# Patient Record
Sex: Female | Born: 1965 | Race: White | Hispanic: No | Marital: Single | Smoking: Never smoker
Health system: Southern US, Community
[De-identification: ages and names within clinical notes are randomized; demographics above are authoritative.]

## PROBLEM LIST (undated history)

## (undated) DIAGNOSIS — F1010 Alcohol abuse, uncomplicated: Secondary | ICD-10-CM

## (undated) DIAGNOSIS — E079 Disorder of thyroid, unspecified: Secondary | ICD-10-CM

## (undated) DIAGNOSIS — D649 Anemia, unspecified: Secondary | ICD-10-CM

## (undated) DIAGNOSIS — K709 Alcoholic liver disease, unspecified: Secondary | ICD-10-CM

## (undated) HISTORY — PX: ANKLE SURGERY: SHX546

## (undated) HISTORY — PX: GASTRIC BYPASS: SHX52

## (undated) HISTORY — PX: CHOLECYSTECTOMY: SHX55

---

## 2015-04-27 ENCOUNTER — Encounter: Payer: Self-pay | Admitting: Emergency Medicine

## 2015-04-27 ENCOUNTER — Emergency Department
Admission: EM | Admit: 2015-04-27 | Discharge: 2015-04-27 | Disposition: A | Discharge status: Home / Self Care | Payer: Self-pay | Attending: Emergency Medicine | Admitting: Emergency Medicine

## 2015-04-27 DIAGNOSIS — T148XXA Other injury of unspecified body region, initial encounter: Secondary | ICD-10-CM

## 2015-04-27 DIAGNOSIS — Z87891 Personal history of nicotine dependence: Secondary | ICD-10-CM | POA: Insufficient documentation

## 2015-04-27 DIAGNOSIS — R05 Cough: Secondary | ICD-10-CM | POA: Insufficient documentation

## 2015-04-27 DIAGNOSIS — L988 Other specified disorders of the skin and subcutaneous tissue: Secondary | ICD-10-CM | POA: Insufficient documentation

## 2015-04-27 HISTORY — DX: Anemia, unspecified: D64.9

## 2015-04-27 HISTORY — DX: Disorder of thyroid, unspecified: E07.9

## 2015-04-27 NOTE — ED Notes (Signed)
Patient reports congestion and cold sx and bottom of nose got irritated during that time.  Now c/o bleeding to irritated area and cannot get it to stop. No active bleeding in triage.  Sent from urgent care bc they think she made need it cauterized.

## 2015-04-27 NOTE — ED Provider Notes (Signed)
[facility] Emergency Department Provider Note ____________________________________________  Time seen: Approximately 1:11 PM  I have reviewed the triage vital signs and the nursing notes.   HISTORY  Chief Complaint   HPI Rhonda Fuentes is a 50 y.o. female who presents today with an irritated bump under the left side of her nose that has been bleeding intermittently for 2 weeks. States she had a cold several weeks ago, and after blowing her nose noticed an area "that cracked open and wouldn't stop bleeding." No previous injuries or skin lesions to nose. Patient clarified that bleeding is from skin under nose and not from inside of nose. After it began bleeding again this morning, she went to urgent care who told her to come to the ED because they thought it may need to be cauterized and didn't have the materials to do this. States it stopped bleeding right before she came to the ED but decided to come anyway since it has been recurring. Positive for rhinorrhea and dry to wet cough. Negative for otalgia, throat pain, SOB, chest pain.  Past Medical History  Diagnosis Date  . Anemia   . Thyroid disease     There are no active problems to display for this patient.   Past Surgical History  Procedure Laterality Date  . Cholecystectomy    . Gastric bypass    . Ankle surgery      No current outpatient prescriptions on file.  Allergies Review of patient's allergies indicates no known allergies.  No family history on file.  Social History Social History  Substance Use Topics  . Smoking status: Former Games developer  . Smokeless tobacco: Not on file  . Alcohol Use: Yes    Review of Systems Constitutional: No fever/chills Eyes: No visual changes. ENT: No otalgia or sore throat. Cardiovascular: Denies chest pain. Respiratory: Mild cough. Denies shortness of breath. Gastrointestinal: No abdominal pain.  No nausea, no vomiting.  No diarrhea.  No  constipation. Genitourinary: Negative for dysuria. Musculoskeletal: Negative for back pain. Skin: Positive for irritated area under nose. Negative for active bleeding. See HPI Neurological: Negative for headaches, focal weakness or numbness.  10-point ROS otherwise negative.  ____________________________________________   PHYSICAL EXAM:  VITAL SIGNS: ED Triage Vitals  Enc Vitals Group     BP 04/27/15 1246 124/60 mmHg     Pulse Rate 04/27/15 1246 108     Resp 04/27/15 1246 18     Temp 04/27/15 1246 97.8 F (36.6 C)     Temp Source 04/27/15 1246 Oral     SpO2 04/27/15 1246 98 %     Weight 04/27/15 1246 174 lb (78.926 kg)     Height 04/27/15 1246  (1.727 m)     Head Cir --      Peak Flow --      Pain Score 04/27/15 1246 2     Pain Loc --      Pain Edu? --      Excl. in GC? --     Constitutional: Alert and oriented. Well appearing and in no acute distress. Eyes: Conjunctivae are normal. PERRL. EOMI. Head: Atraumatic. Nose: No epistaxis or rhinorrhea.  Mouth/Throat: Mucous membranes are moist.  Oropharynx non-erythematous. Neck: No stridor.   Cardiovascular: Normal rate, regular rhythm. Grossly normal heart sounds.  Good peripheral circulation. Respiratory: Normal respiratory effort.  No retractions. Lungs CTAB. Gastrointestinal: Soft and nontender. No distention. No abdominal bruits. No CVA tenderness. Musculoskeletal: No lower extremity tenderness nor edema.  No  joint effusions. Neurologic:  Normal speech and language. No gross focal neurologic deficits are appreciated. No gait instability. Skin:  Small, 2 mm vesicle under left nostril with erythema and dried blood. No active bleeding. Skin is warm and dry. No rash noted.  Psychiatric: Mood and affect are normal. Speech and behavior are normal.  ____________________________________________   LABS (all labs ordered are listed, but only abnormal results are displayed)  Labs Reviewed - No data to  display ____________________________________________  EKG   ____________________________________________  RADIOLOGY   ____________________________________________   PROCEDURES  Procedure(s) performed: None  Critical Care performed: No  ____________________________________________   INITIAL IMPRESSION / ASSESSMENT AND PLAN / ED COURSE  Pertinent labs & imaging results that were available during my care of the patient were reviewed by me and considered in my medical decision making (see chart for details).  Hemorrhagic cutaneous lesion ____________________________________________   FINAL CLINICAL IMPRESSION(S) / ED DIAGNOSES  Final diagnoses:  Blood blister      Joni Reining, PA-C 04/27/15 1336  Rockne Menghini, MD 04/27/15 [phone]

## 2015-04-27 NOTE — Discharge Instructions (Signed)
Advised trial of OTC preparation to freezed the lesion. If not resolved follow up with Dermatologist.

## 2015-04-27 NOTE — ED Notes (Signed)
States congested  And nose became irritated

## 2015-07-15 ENCOUNTER — Inpatient Hospital Stay
Admission: EM | Admit: 2015-07-15 | Discharge: 2015-07-19 | DRG: 433 | Disposition: A | Discharge status: Home / Self Care | Payer: BLUE CROSS/BLUE SHIELD | Attending: Internal Medicine | Admitting: Internal Medicine

## 2015-07-15 ENCOUNTER — Inpatient Hospital Stay: Payer: BLUE CROSS/BLUE SHIELD

## 2015-07-15 DIAGNOSIS — K7040 Alcoholic hepatic failure without coma: Secondary | ICD-10-CM | POA: Diagnosis present

## 2015-07-15 DIAGNOSIS — I8510 Secondary esophageal varices without bleeding: Secondary | ICD-10-CM | POA: Diagnosis present

## 2015-07-15 DIAGNOSIS — K7031 Alcoholic cirrhosis of liver with ascites: Secondary | ICD-10-CM | POA: Diagnosis present

## 2015-07-15 DIAGNOSIS — Z79899 Other long term (current) drug therapy: Secondary | ICD-10-CM | POA: Diagnosis not present

## 2015-07-15 DIAGNOSIS — R600 Localized edema: Secondary | ICD-10-CM | POA: Diagnosis present

## 2015-07-15 DIAGNOSIS — E639 Nutritional deficiency, unspecified: Secondary | ICD-10-CM

## 2015-07-15 DIAGNOSIS — F5104 Psychophysiologic insomnia: Secondary | ICD-10-CM | POA: Diagnosis present

## 2015-07-15 DIAGNOSIS — K7011 Alcoholic hepatitis with ascites: Secondary | ICD-10-CM | POA: Diagnosis present

## 2015-07-15 DIAGNOSIS — E039 Hypothyroidism, unspecified: Secondary | ICD-10-CM | POA: Diagnosis present

## 2015-07-15 DIAGNOSIS — Z9884 Bariatric surgery status: Secondary | ICD-10-CM

## 2015-07-15 DIAGNOSIS — F1021 Alcohol dependence, in remission: Secondary | ICD-10-CM | POA: Diagnosis present

## 2015-07-15 DIAGNOSIS — I864 Gastric varices: Secondary | ICD-10-CM | POA: Diagnosis present

## 2015-07-15 DIAGNOSIS — E871 Hypo-osmolality and hyponatremia: Secondary | ICD-10-CM | POA: Diagnosis present

## 2015-07-15 DIAGNOSIS — E860 Dehydration: Secondary | ICD-10-CM | POA: Diagnosis present

## 2015-07-15 DIAGNOSIS — K766 Portal hypertension: Secondary | ICD-10-CM | POA: Diagnosis present

## 2015-07-15 DIAGNOSIS — K7200 Acute and subacute hepatic failure without coma: Secondary | ICD-10-CM

## 2015-07-15 HISTORY — DX: Alcohol abuse, uncomplicated: F10.10

## 2015-07-15 LAB — PROTIME-INR
INR: 1.71
Prothrombin Time: 20.1 seconds — ABNORMAL HIGH (ref 11.4–15.0)

## 2015-07-15 LAB — CBC WITH DIFFERENTIAL/PLATELET
BASOS ABS: 0 10*3/uL (ref 0–0.1)
BASOS PCT: 0 %
Eosinophils Absolute: 0 10*3/uL (ref 0–0.7)
Eosinophils Relative: 0 %
HEMATOCRIT: 41.5 % (ref 35.0–47.0)
HEMOGLOBIN: 14.2 g/dL (ref 12.0–16.0)
LYMPHS PCT: 8 %
Lymphs Abs: 0.9 10*3/uL — ABNORMAL LOW (ref 1.0–3.6)
MCH: 41.2 pg — ABNORMAL HIGH (ref 26.0–34.0)
MCHC: 34.1 g/dL (ref 32.0–36.0)
MCV: 120.6 fL — AB (ref 80.0–100.0)
MONO ABS: 1 10*3/uL — AB (ref 0.2–0.9)
Monocytes Relative: 9 %
NEUTROS ABS: 9.4 10*3/uL — AB (ref 1.4–6.5)
NEUTROS PCT: 83 %
Platelets: 203 10*3/uL (ref 150–440)
RBC: 3.45 MIL/uL — AB (ref 3.80–5.20)
RDW: 16.6 % — AB (ref 11.5–14.5)
WBC: 11.3 10*3/uL — AB (ref 3.6–11.0)

## 2015-07-15 LAB — COMPREHENSIVE METABOLIC PANEL
ALBUMIN: 2.8 g/dL — AB (ref 3.5–5.0)
ALT: 72 U/L — ABNORMAL HIGH (ref 14–54)
ANION GAP: 11 (ref 5–15)
AST: 220 U/L — AB (ref 15–41)
Alkaline Phosphatase: 270 U/L — ABNORMAL HIGH (ref 38–126)
BILIRUBIN TOTAL: 18.4 mg/dL — AB (ref 0.3–1.2)
BUN: 6 mg/dL (ref 6–20)
CALCIUM: 8.2 mg/dL — AB (ref 8.9–10.3)
CO2: 25 mmol/L (ref 22–32)
Chloride: 91 mmol/L — ABNORMAL LOW (ref 101–111)
Creatinine, Ser: 0.36 mg/dL — ABNORMAL LOW (ref 0.44–1.00)
GLUCOSE: 103 mg/dL — AB (ref 65–99)
POTASSIUM: 4 mmol/L (ref 3.5–5.1)
SODIUM: 127 mmol/L — AB (ref 135–145)
TOTAL PROTEIN: 7 g/dL (ref 6.5–8.1)

## 2015-07-15 LAB — VITAMIN B12: VITAMIN B 12: 936 pg/mL — AB (ref 180–914)

## 2015-07-15 LAB — TSH: TSH: 2.968 u[IU]/mL (ref 0.350–4.500)

## 2015-07-15 LAB — TROPONIN I: Troponin I: 0.03 ng/mL (ref <0.031)

## 2015-07-15 LAB — ACETAMINOPHEN LEVEL: Acetaminophen (Tylenol), Serum: <10 ug/mL — ABNORMAL LOW (ref 10–30)

## 2015-07-15 LAB — LIPASE, BLOOD: LIPASE: 16 U/L (ref 11–51)

## 2015-07-15 LAB — BILIRUBIN, DIRECT: BILIRUBIN DIRECT: 7.7 mg/dL — AB (ref 0.1–0.5)

## 2015-07-15 LAB — FOLATE: FOLATE: 10 ng/mL (ref >5.9)

## 2015-07-15 LAB — AMMONIA: AMMONIA: 50 umol/L — AB (ref 9–35)

## 2015-07-15 LAB — T4, FREE: FREE T4: 1.46 ng/dL — AB (ref 0.61–1.12)

## 2015-07-15 LAB — OSMOLALITY: Osmolality: 278 mOsm/kg (ref 275–295)

## 2015-07-15 LAB — LACTATE DEHYDROGENASE: LDH: 569 U/L — ABNORMAL HIGH (ref 98–192)

## 2015-07-15 LAB — ETHANOL

## 2015-07-15 MED ORDER — ONDANSETRON HCL 4 MG PO TABS: 4.0000 mg | ORAL_TABLET | Freq: Four times a day (QID) | ORAL | Status: DC | PRN

## 2015-07-15 MED ORDER — SODIUM CHLORIDE 0.9% FLUSH: 3.0000 mL | INTRAVENOUS | Status: DC | PRN

## 2015-07-15 MED ORDER — OXYCODONE HCL 5 MG PO TABS
5.0000 mg | ORAL_TABLET | ORAL | Status: DC | PRN
  Administered 2015-07-15 – 2015-07-18 (×11): 5 mg via ORAL
  Filled 2015-07-15 (×12): qty 1

## 2015-07-15 MED ORDER — FOLIC ACID 1 MG PO TABS
1.0000 mg | ORAL_TABLET | Freq: Every day | ORAL | Status: DC
  Administered 2015-07-16 – 2015-07-19 (×4): 1 mg via ORAL
  Filled 2015-07-15 (×4): qty 1

## 2015-07-15 MED ORDER — VITAMIN B-1 100 MG PO TABS
100.0000 mg | ORAL_TABLET | Freq: Every day | ORAL | Status: DC
  Administered 2015-07-16 – 2015-07-19 (×4): 100 mg via ORAL
  Filled 2015-07-15 (×4): qty 1

## 2015-07-15 MED ORDER — SODIUM CHLORIDE 0.9 % IV BOLUS (SEPSIS)
1000.0000 mL | Freq: Once | INTRAVENOUS | Status: AC
  Administered 2015-07-15: 1000 mL via INTRAVENOUS

## 2015-07-15 MED ORDER — SPIRONOLACTONE 25 MG PO TABS: 50.0000 mg | ORAL_TABLET | Freq: Every day | ORAL | Status: DC

## 2015-07-15 MED ORDER — FOLIC ACID 1 MG PO TABS: 1.0000 mg | ORAL_TABLET | Freq: Every day | ORAL | Status: DC

## 2015-07-15 MED ORDER — FOLIC ACID 1 MG PO TABS
1.0000 mg | ORAL_TABLET | Freq: Once | ORAL | Status: AC
  Administered 2015-07-15: 1 mg via ORAL
  Filled 2015-07-15: qty 1

## 2015-07-15 MED ORDER — FUROSEMIDE 40 MG PO TABS: 20.0000 mg | ORAL_TABLET | Freq: Every day | ORAL | Status: DC

## 2015-07-15 MED ORDER — THIAMINE HCL 100 MG/ML IJ SOLN
100.0000 mg | Freq: Once | INTRAMUSCULAR | Status: AC
  Administered 2015-07-15: 100 mg via INTRAVENOUS
  Filled 2015-07-15: qty 2

## 2015-07-15 MED ORDER — SODIUM CHLORIDE 0.9% FLUSH: 3.0000 mL | Freq: Two times a day (BID) | INTRAVENOUS | Status: DC

## 2015-07-15 MED ORDER — SODIUM CHLORIDE 0.9 % IV SOLN: 250.0000 mL | INTRAVENOUS | Status: DC | PRN

## 2015-07-15 MED ORDER — SODIUM CHLORIDE 0.9 % IV BOLUS (SEPSIS)
500.0000 mL | Freq: Once | INTRAVENOUS | Status: AC
  Administered 2015-07-15: 500 mL via INTRAVENOUS

## 2015-07-15 MED ORDER — ONDANSETRON HCL 4 MG/2ML IJ SOLN
4.0000 mg | Freq: Four times a day (QID) | INTRAMUSCULAR | Status: DC | PRN
  Administered 2015-07-17: 4 mg via INTRAVENOUS
  Filled 2015-07-15: qty 2

## 2015-07-15 MED ORDER — SODIUM CHLORIDE 0.9% FLUSH
3.0000 mL | Freq: Two times a day (BID) | INTRAVENOUS | Status: DC
  Administered 2015-07-15 – 2015-07-19 (×9): 3 mL via INTRAVENOUS

## 2015-07-15 MED ORDER — LEVOTHYROXINE SODIUM 50 MCG PO TABS
50.0000 ug | ORAL_TABLET | Freq: Every day | ORAL | Status: DC
  Administered 2015-07-16 – 2015-07-18 (×3): 50 ug via ORAL
  Filled 2015-07-15 (×4): qty 1

## 2015-07-15 MED ORDER — PANTOPRAZOLE SODIUM 40 MG PO TBEC
40.0000 mg | DELAYED_RELEASE_TABLET | Freq: Every day | ORAL | Status: DC
  Administered 2015-07-15 – 2015-07-19 (×5): 40 mg via ORAL
  Filled 2015-07-15 (×5): qty 1

## 2015-07-15 MED ORDER — LACTULOSE 10 GM/15ML PO SOLN
20.0000 g | Freq: Two times a day (BID) | ORAL | Status: DC
  Administered 2015-07-15 (×2): 20 g via ORAL
  Filled 2015-07-15 (×2): qty 30

## 2015-07-15 NOTE — Progress Notes (Signed)
Pt. C/o back pain and requests heating pad. Dr. Toney RakesSuidini notified and ordered k-pad.

## 2015-07-15 NOTE — ED Notes (Signed)
Pt returned from US at this time.

## 2015-07-15 NOTE — H&P (Signed)
[facility] - [place] [facility]   PATIENT NAME: Rhonda Fuentes    MR#:  [id number]  DATE OF BIRTH:  05-22-1965  DATE OF ADMISSION:  07/15/2015  PRIMARY CARE PHYSICIAN: No PCP Per Patient   REQUESTING/REFERRING PHYSICIAN: Dr. Scotty Court  CHIEF COMPLAINT:   Chief Complaint  Patient presents with  . Cough  . Leg Swelling  . Weakness    HISTORY OF PRESENT ILLNESS:  Rhonda Fuentes  is a 50 y.o. female with a known history of anemia, hypothyroidism, alcohol abuse presents to the emergency room complaining of 10 days of yellowing of the eyes and skin and loss of balance. Patient has been feeling weak since January when she had a chest cold and never recovered from the weakness. Had a mechanical fall. Complains of some dizziness. Has noticed some dark stools but stool for Hemoccult in the emergency room was negative. No melena or bleeding. Does notice easy bruising. She drinks 2-3 large drinks of vodka every day. She quit 10 days back and went through withdrawals along with hallucinations at home. Presently feels well. Here patient has been found to have elevated bilirubin of 18 along with elevated liver function tests. She has also been using intermittent Tylenol. She has also noticed lower extremity edema and abdominal distention for the past 10 days.  PAST MEDICAL HISTORY:   Past Medical History  Diagnosis Date  . Anemia   . Thyroid disease   . Alcohol abuse     PAST SURGICAL HISTORY:   Past Surgical History  Procedure Laterality Date  . Cholecystectomy    . Gastric bypass    . Ankle surgery      SOCIAL HISTORY:   Social History  Substance Use Topics  . Smoking status: Never Smoker   . Smokeless tobacco: Not on file  . Alcohol Use: 12.0 oz/week    20 Standard drinks or equivalent per week    FAMILY HISTORY:   Family History  Problem Relation Age of Onset  . Breast cancer Sister     DRUG ALLERGIES:  No Known Allergies  REVIEW OF  SYSTEMS:   Review of Systems  Constitutional: Positive for malaise/fatigue. Negative for fever and chills.  HENT: Negative for sore throat.   Eyes: Negative for blurred vision, double vision and pain.  Respiratory: Negative for cough, hemoptysis, shortness of breath and wheezing.   Cardiovascular: Negative for chest pain, palpitations, orthopnea and leg swelling.  Gastrointestinal: Negative for heartburn, nausea, vomiting, abdominal pain, diarrhea and constipation.  Genitourinary: Negative for dysuria and hematuria.  Musculoskeletal: Positive for myalgias. Negative for back pain and joint pain.  Skin: Negative for rash.  Neurological: Positive for dizziness and weakness. Negative for sensory change, speech change, focal weakness and headaches.  Endo/Heme/Allergies: Bruises/bleeds easily.  Psychiatric/Behavioral: Positive for hallucinations. Negative for depression. The patient is not nervous/anxious.     MEDICATIONS AT HOME:   Prior to Admission medications   Medication Sig Start Date End Date Taking? Authorizing Provider  levothyroxine (SYNTHROID, LEVOTHROID) 50 MCG tablet Take 1 tablet by mouth daily. 05/26/15  Yes Historical Provider, MD  traZODone (DESYREL) 50 MG tablet Take 1 tablet by mouth at bedtime. 05/26/15  Yes Historical Provider, MD     VITAL SIGNS:  Blood pressure 106/81, pulse 106, temperature 97.9 F (36.6 C), temperature source Oral, resp. rate 30, height  (1.727 m), weight 91.627 kg (202 lb), last menstrual period 10/21/2014, SpO2 98 %.  PHYSICAL EXAMINATION:  Physical Exam  GENERAL:  50 y.o.-year-old patient lying in the bed with no acute distress.  EYES: Pupils equal, round, reactive to light and accommodation.  Extraocular muscles intact. Scleral icterus HEENT: Head atraumatic, normocephalic. Oropharynx and nasopharynx clear. No oropharyngeal erythema, moist oral mucosa  NECK:  Supple, no jugular venous distention. No thyroid enlargement, no tenderness.   LUNGS: Normal breath sounds bilaterally, no wheezing, rales, rhonchi. No use of accessory muscles of respiration.  CARDIOVASCULAR: S1, S2 normal. No murmurs, rubs, or gallops.  ABDOMEN: Soft, nontender, nondistended. Bowel sounds present. No organomegaly or mass.  EXTREMITIES: No cyanosis, or clubbing. + 2 pedal & radial pulses b/l.  2+ lower extremity edema. NEUROLOGIC: Cranial nerves II through XII are intact. No focal Motor or sensory deficits appreciated b/l PSYCHIATRIC: The patient is alert and oriented x 3. Good affect.  SKIN: No obvious rash, lesion, or ulcer. Icterus  LABORATORY PANEL:   CBC  Recent Labs Lab 07/15/15 1050  WBC 11.3*  HGB 14.2  HCT 41.5  PLT 203   ------------------------------------------------------------------------------------------------------------------  Chemistries   Recent Labs Lab 07/15/15 1050  NA 127*  K 4.0  CL 91*  CO2 25  GLUCOSE 103*  BUN 6  CREATININE 0.36*  CALCIUM 8.2*  AST 220*  ALT 72*  ALKPHOS 270*  BILITOT 18.4*   ------------------------------------------------------------------------------------------------------------------  Cardiac Enzymes  Recent Labs Lab 07/15/15 1050  TROPONINI 0.03   ------------------------------------------------------------------------------------------------------------------  RADIOLOGY:  No results found.   IMPRESSION AND PLAN:   * Acute alcoholic hepatitis Check direct bilirubin. INR. Acute hepatitis panel. Will need to start steroids if patient meets criteria once INR level is available. Consult GI. Check abdominal ultrasound Protonix for GI prophylaxis  * Hypovolemic hyponatremia Patient seems dehydrated although she has some lower extremity edema.  Bolus 500 ML normal saline.  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 Waiting on INR.  All the records are reviewed  and case discussed with ED provider. Management plans discussed with the patient, family and they are in agreement.  CODE STATUS: FULL CODE  TOTAL TIME TAKING CARE OF THIS PATIENT: 45 minutes.   Milagros LollSudini, Miklo Aken R M.D on 07/15/2015 at 1:06 PM  Between 7am to 6pm - Pager - [phone]  After 6pm go to [email or web address] - password [email or web address] [facility]  [facility]  Office  [phone]  CC: Primary care physician; No PCP Per Patient  Note: This dictation was prepared with Dragon dictation along with smaller phrase technology. Any transcriptional errors that result from this process are unintentional.

## 2015-07-15 NOTE — ED Notes (Signed)
Pt c/o cough, congestion since Jan.. States progressed with Bl LE swelling with dizziness for the past 3 weeks, then noticed skin color yellow a few days ago..Marland Kitchen

## 2015-07-15 NOTE — ED Provider Notes (Signed)
[facility] Emergency Department Provider Note  ____________________________________________  Time seen: 12:00 PM  I have reviewed the triage vital signs and the nursing notes.   HISTORY  Chief Complaint Cough; Leg Swelling; and Weakness    HPI Rhonda Fuentes is a 50 y.o. female who complains of yellow eyes and skin worsening for the past 2 weeks as well as bilateral lower extremity swelling and dizziness worsening over the last 3 weeks as well.She's been very unsteady on her feet and feels like her balance is diminished.  Denies any pruritus, no chest pain abdominal pain nausea vomiting diarrhea or shortness of breath. She does feel like her stools have been blacker recently. No gross bleeding. She is a chronic daily drinker. She reports 1-3 drinks daily, however 10 days ago because of these issues, she abruptly stop drinking entirely. 7 days ago she became shaky and then had some visual hallucinations where she saw mice running around on the staircase and going through holes in the wall. Her husband reports that there were no mice and no holes in the wall. She also had been chronically using intermittent doses of Tylenol which she also stopped about 10 days ago.  Denies falls or head trauma   Past Medical History  Diagnosis Date  . Anemia   . Thyroid disease   Hypothyroidism   There are no active problems to display for this patient.    Past Surgical History  Procedure Laterality Date  . Cholecystectomy    . Gastric bypass    . Ankle surgery       Current Outpatient Rx  Name  Route  Sig  Dispense  Refill  . levothyroxine (SYNTHROID, LEVOTHROID) 50 MCG tablet   Oral   Take 1 tablet by mouth daily.      2   . traZODone (DESYREL) 50 MG tablet   Oral   Take 1 tablet by mouth at bedtime.      2      Allergies Review of patient's allergies indicates no known allergies.   No family history on file.  Social History Social History   Substance Use Topics  . Smoking status: Never Smoker   . Smokeless tobacco: None  . Alcohol Use: Yes  No history of drug use other than edible cannabis  Review of Systems  Constitutional:   No fever or chills. No weight changes Eyes:   No vision changes. Yelling of the eyes ENT:   No sore throat. No rhinorrhea. Cardiovascular:   No chest pain. Respiratory:   No dyspnea or cough. Gastrointestinal:   Negative for abdominal pain, vomiting and diarrhea.  No BRBPR or melena. Genitourinary:   Negative for dysuria or difficulty urinating. Musculoskeletal:   Negative for focal pain or swelling Skin:   Diffuse swelling of the abdomen and lower extremities. Yellowing of the skin Neurological:   Negative for headaches, focal weakness or numbness. Positive dizziness and unsteadiness with balance and walking  10-point ROS otherwise negative.  ____________________________________________   PHYSICAL EXAM:  VITAL SIGNS: ED Triage Vitals  Enc Vitals Group     BP 07/15/15 1036 97/57 mmHg     Pulse Rate 07/15/15 1036 108     Resp 07/15/15 1036 18     Temp 07/15/15 1036 97.9 F (36.6 C)     Temp Source 07/15/15 1036 Oral     SpO2 07/15/15 1036 97 %     Weight 07/15/15 1036 202 lb (91.627 kg)     Height 07/15/15  1036  (1.727 m)     Head Cir --      Peak Flow --      Pain Score 07/15/15 1045 2     Pain Loc --      Pain Edu? --      Excl. in GC? --     Vital signs reviewed, nursing assessments reviewed.   Constitutional:   Alert and oriented. Well appearing and in no distress. Eyes:   Bright scleral icterus. No conjunctival pallor. PERRL. EOMI ENT   Head:   Normocephalic and atraumatic.   Nose:   No congestion/rhinnorhea. No septal hematoma   Mouth/Throat:   Dry mucous membranes, no pharyngeal erythema. No peritonsillar mass.    Neck:   No stridor. No SubQ emphysema. No meningismus. Hematological/Lymphatic/Immunilogical:   No cervical  lymphadenopathy. Cardiovascular:   Tachycardia heart rate 110. Symmetric bilateral radial and DP pulses.  No murmurs.  Respiratory:   Normal respiratory effort without tachypnea nor retractions. Breath sounds are clear and equal bilaterally. No wheezes/rales/rhonchi. Gastrointestinal:   Soft and nontender. Mildly distended, likely ascites. There is no CVA tenderness.  No rebound, rigidity, or guarding. Rectal exam performed with nurse Megan, brown stool, Hemoccult negative, QC controls okay Genitourinary:   deferred Musculoskeletal:   Nontender with normal range of motion in all extremities. No joint effusions.  No lower extremity tenderness.  Trace pitting edema bilateral lower extremities. Neurologic:   Normal speech and language.  CN 2-10 normal. Motor grossly intact. No gross focal neurologic deficits are appreciated.  Skin:    Skin is warm, dry and intact. No rash noted.  No petechiae, purpura, or bullae. Psychiatric:   Mood and affect are normal. ____________________________________________    LABS (pertinent positives/negatives) (all labs ordered are listed, but only abnormal results are displayed) Labs Reviewed  ACETAMINOPHEN LEVEL - Abnormal; Notable for the following:    Acetaminophen (Tylenol), Serum <10 (*)    All other components within normal limits  COMPREHENSIVE METABOLIC PANEL - Abnormal; Notable for the following:    Sodium 127 (*)    Chloride 91 (*)    Glucose, Bld 103 (*)    Creatinine, Ser 0.36 (*)    Calcium 8.2 (*)    Albumin 2.8 (*)    AST 220 (*)    ALT 72 (*)    Alkaline Phosphatase 270 (*)    Total Bilirubin 18.4 (*)    All other components within normal limits  CBC WITH DIFFERENTIAL/PLATELET - Abnormal; Notable for the following:    WBC 11.3 (*)    RBC 3.45 (*)    MCV 120.6 (*)    MCH 41.2 (*)    RDW 16.6 (*)    Neutro Abs 9.4 (*)    Lymphs Abs 0.9 (*)    Monocytes Absolute 1.0 (*)    All other components within normal limits  AMMONIA -  Abnormal; Notable for the following:    Ammonia 50 (*)    All other components within normal limits  TROPONIN I  ETHANOL  LIPASE, BLOOD  URINALYSIS COMPLETEWITH MICROSCOPIC ([facility] ONLY)  TSH  T4, FREE   ____________________________________________   EKG    ____________________________________________    RADIOLOGY    ____________________________________________   PROCEDURES   ____________________________________________   INITIAL IMPRESSION / ASSESSMENT AND PLAN / ED COURSE  Pertinent labs & imaging results that were available during my care of the patient were reviewed by me and considered in my medical decision making (see chart for details).  Patient presents with icterus and jaundice and by report a history of alcohol dependence that has recently completed spontaneous detox at home without medications. At present she does not appear to be in withdrawal and a low suspicion for DTs or autonomic instability at present time. She does however have acute liver failure on labs with hyponatremia and hyperbilirubinemia and elevations of her LFTs. Tylenol level is negative currently. Blood counts are acceptable although her MCV is markedly elevated. Her neurologic symptoms may be due to the hypernatremia or more likely due to severe nutritional depletion. We'll give thiamine and folate in the emergency department, IV fluids for dehydration, plan to admit for further evaluation.     ____________________________________________   FINAL CLINICAL IMPRESSION(S) / ED DIAGNOSES  Final diagnoses:  Acute liver failure  Alcoholic hepatitis with ascites  Nutritional deficiency  Hyponatremia    Sharman Cheek, MD 07/15/15 1225

## 2015-07-15 NOTE — ED Notes (Signed)
Report given to Matthew, RN 

## 2015-07-16 ENCOUNTER — Encounter: Payer: Self-pay | Admitting: Radiology

## 2015-07-16 ENCOUNTER — Inpatient Hospital Stay: Payer: BLUE CROSS/BLUE SHIELD

## 2015-07-16 LAB — URINALYSIS COMPLETE WITH MICROSCOPIC (ARMC ONLY)
Bacteria, UA: NONE SEEN
GLUCOSE, UA: NEGATIVE mg/dL
HGB URINE DIPSTICK: NEGATIVE
Ketones, ur: NEGATIVE mg/dL
LEUKOCYTES UA: NEGATIVE
NITRITE: NEGATIVE
Protein, ur: NEGATIVE mg/dL
SPECIFIC GRAVITY, URINE: 1.034 — AB (ref 1.005–1.030)
pH: 6 (ref 5.0–8.0)

## 2015-07-16 LAB — COMPREHENSIVE METABOLIC PANEL
ALT: 52 U/L (ref 14–54)
AST: 160 U/L — ABNORMAL HIGH (ref 15–41)
Albumin: 2 g/dL — ABNORMAL LOW (ref 3.5–5.0)
Alkaline Phosphatase: 161 U/L — ABNORMAL HIGH (ref 38–126)
Anion gap: 8 (ref 5–15)
BUN: 5 mg/dL — ABNORMAL LOW (ref 6–20)
CHLORIDE: 98 mmol/L — AB (ref 101–111)
CO2: 23 mmol/L (ref 22–32)
CREATININE: 0.39 mg/dL — AB (ref 0.44–1.00)
Calcium: 7.4 mg/dL — ABNORMAL LOW (ref 8.9–10.3)
Glucose, Bld: 124 mg/dL — ABNORMAL HIGH (ref 65–99)
Potassium: 3.2 mmol/L — ABNORMAL LOW (ref 3.5–5.1)
Sodium: 129 mmol/L — ABNORMAL LOW (ref 135–145)
Total Bilirubin: 13.6 mg/dL — ABNORMAL HIGH (ref 0.3–1.2)
Total Protein: 5.3 g/dL — ABNORMAL LOW (ref 6.5–8.1)

## 2015-07-16 LAB — CBC
HCT: 34.6 % — ABNORMAL LOW (ref 35.0–47.0)
Hemoglobin: 11.9 g/dL — ABNORMAL LOW (ref 12.0–16.0)
MCH: 43 pg — ABNORMAL HIGH (ref 26.0–34.0)
MCHC: 34.5 g/dL (ref 32.0–36.0)
MCV: 124.4 fL — ABNORMAL HIGH (ref 80.0–100.0)
PLATELETS: 133 10*3/uL — AB (ref 150–440)
RBC: 2.78 MIL/uL — AB (ref 3.80–5.20)
RDW: 15.8 % — ABNORMAL HIGH (ref 11.5–14.5)
WBC: 7.9 10*3/uL (ref 3.6–11.0)

## 2015-07-16 LAB — HEPATITIS PANEL, ACUTE
HCV Ab: <0.1 s/co ratio (ref 0.0–0.9)
Hep A IgM: NEGATIVE
Hep B C IgM: NEGATIVE
Hepatitis B Surface Ag: NEGATIVE

## 2015-07-16 LAB — PROTIME-INR
INR: 2.23
PROTHROMBIN TIME: 24.5 s — AB (ref 11.4–15.0)

## 2015-07-16 LAB — OSMOLALITY, URINE: OSMOLALITY UR: 200 mosm/kg — AB (ref 300–900)

## 2015-07-16 LAB — SODIUM, URINE, RANDOM

## 2015-07-16 LAB — CREATININE, URINE, RANDOM: Creatinine, Urine: 32 mg/dL

## 2015-07-16 LAB — AMMONIA: AMMONIA: 70 umol/L — AB (ref 9–35)

## 2015-07-16 MED ORDER — LACTULOSE 10 GM/15ML PO SOLN
30.0000 g | Freq: Two times a day (BID) | ORAL | Status: DC
Start: 2015-07-16 — End: 2015-07-19
  Administered 2015-07-16 – 2015-07-19 (×7): 30 g via ORAL
  Filled 2015-07-16 (×7): qty 60

## 2015-07-16 MED ORDER — METHYLPREDNISOLONE SODIUM SUCC 125 MG IJ SOLR
125.0000 mg | Freq: Every day | INTRAMUSCULAR | Status: DC
  Administered 2015-07-16 – 2015-07-19 (×4): 125 mg via INTRAVENOUS
  Filled 2015-07-16 (×4): qty 2

## 2015-07-16 MED ORDER — IOPAMIDOL (ISOVUE-300) INJECTION 61%
100.0000 mL | Freq: Once | INTRAVENOUS | Status: AC | PRN
  Administered 2015-07-16: 100 mL via INTRAVENOUS

## 2015-07-16 NOTE — Progress Notes (Signed)
[facility]   PATIENT NAME: Rhonda Fuentes    MR#:  [id number]  DATE OF BIRTH:  1965-05-04  SUBJECTIVE:  CHIEF COMPLAINT:   Chief Complaint  Patient presents with  . Cough  . Leg Swelling  . Weakness      Have jaundice, was a chronic alcoholic.    Some abdominal distention, but not much pain.  REVIEW OF SYSTEMS:  CONSTITUTIONAL: No fever, positive for fatigue or weakness.  EYES: No blurred or double vision.  EARS, NOSE, AND THROAT: No tinnitus or ear pain.  RESPIRATORY: No cough, shortness of breath, wheezing or hemoptysis.  CARDIOVASCULAR: No chest pain, orthopnea, edema.  GASTROINTESTINAL: No nausea, vomiting, diarrhea or abdominal pain.  GENITOURINARY: No dysuria, hematuria.  ENDOCRINE: No polyuria, nocturia,  HEMATOLOGY: No anemia, easy bruising or bleeding SKIN: No rash or lesion.yellowish colour. MUSCULOSKELETAL: No joint pain or arthritis.   NEUROLOGIC: No tingling, numbness, weakness.  PSYCHIATRY: No anxiety or depression.   ROS  DRUG ALLERGIES:  No Known Allergies  VITALS:  Blood pressure 102/64, pulse 103, temperature 98.3 F (36.8 C), temperature source Oral, resp. rate 16, height 5\' 8"  (1.727 m), weight 93.804 kg (206 lb 12.8 oz), last menstrual period 10/21/2014, SpO2 95 %.  PHYSICAL EXAMINATION:  GENERAL:  50 y.o.-year-old patient lying in the bed with no acute distress.  EYES: Pupils equal, round, reactive to light and accommodation. No scleral icterus. Extraocular muscles intact. Sclera yellow. HEENT: Head atraumatic, normocephalic. Oropharynx and nasopharynx clear.  NECK:  Supple, no jugular venous distention. No thyroid enlargement, no tenderness.  LUNGS: Normal breath sounds bilaterally, no wheezing, rales,rhonchi or crepitation. No use of accessory muscles of respiration.  CARDIOVASCULAR: S1, S2 normal. No murmurs, rubs, or gallops.  ABDOMEN: Soft, nontender, mild distended. Bowel sounds present. No  organomegaly or mass.  EXTREMITIES: No pedal edema, cyanosis, or clubbing.  NEUROLOGIC: Cranial nerves II through XII are intact. Muscle strength 5/5 in all extremities. Sensation intact. Gait not checked. No tremors. PSYCHIATRIC: The patient is alert and oriented x 3.  SKIN: No obvious rash, lesion, or ulcer. Yellowish skin.  Physical Exam LABORATORY PANEL:   CBC  Recent Labs Lab 07/16/15 0425  WBC 7.9  HGB 11.9*  HCT 34.6*  PLT 133*   ------------------------------------------------------------------------------------------------------------------  Chemistries   Recent Labs Lab 07/16/15 0425  NA 129*  K 3.2*  CL 98*  CO2 23  GLUCOSE 124*  BUN 5*  CREATININE 0.39*  CALCIUM 7.4*  AST 160*  ALT 52  ALKPHOS 161*  BILITOT 13.6*   ------------------------------------------------------------------------------------------------------------------  Cardiac Enzymes  Recent Labs Lab 07/15/15 1050  TROPONINI 0.03   ------------------------------------------------------------------------------------------------------------------  RADIOLOGY:  [place] Abdomen Limited Ruq  07/15/2015  CLINICAL DATA:  Alcoholic hepatitis with ascites. EXAM: US ABDOMEN LIMITED - RIGHT UPPER QUADRANT COMPARISON:  None. FINDINGS: Gallbladder: Status post cholecystectomy. Common bile duct: Diameter: 7 mm which is within normal limits for post cholecystectomy status. Liver: Diffusely increased echogenicity is noted without definite focal abnormality. Minimal amount of free fluid is seen anterior to the liver. IMPRESSION: Status post cholecystectomy. Diffusely increased echogenicity of hepatic parenchyma is noted which may represent fatty infiltration or other diffuse hepatocellular disease. Minimal free fluid is seen anterior to the liver. Possible nodular contours are seen anteriorly in the left hepatic lobe which may represent cirrhosis. Clinical correlation is recommended. Electronically Signed   By:  Lupita RaiderJames  Green Jr, M.D.   On: 07/15/2015 14:09    ASSESSMENT AND PLAN:   Active  Problems:   Alcoholic hepatitis with ascites  * Acute alcoholic hepatitis  high direct bilirubin & INR. Acute hepatitis panel sent- negative.  As INR is also high- started on IV solumedrol. Consulted GI. Check abdominal ultrasound,also getting CT by GI. Protonix for GI prophylaxis  * Hypovolemic hyponatremia Patient seems dehydrated although she has some lower extremity edema.  Bolus 500 ML normal saline.   Monitor Na.  * Elevated ammonia due to liver disease Start lactulose  * Hypothyroidism Checked TSH and free T4 level Continue levothyroxine  * DVT prophylaxis SCDs. INR is high.   All the records are reviewed and case discussed with Care Management/Social Workerr. Management plans discussed with the patient, family and they are in agreement.  CODE STATUS: Full  TOTAL TIME TAKING CARE OF THIS PATIENT: 40 minutes.   POSSIBLE D/C IN 2-3 DAYS, DEPENDING ON CLINICAL CONDITION.   Altamese Dilling M.D on 07/16/2015   Between 7am to 6pm - Pager - [phone]  After 6pm go to [email or web address] - Social research officer, government  [facility]  Office  [phone]  CC: Primary care physician; No PCP Per Patient  Note: This dictation was prepared with Dragon dictation along with smaller phrase technology. Any transcriptional errors that result from this process are unintentional.

## 2015-07-16 NOTE — Progress Notes (Signed)
Pts. Ammonia level increased from 50 to 70. Dr. Sheryle Hailiamond notified and lactulose increased.

## 2015-07-16 NOTE — Consult Note (Signed)
GI Inpatient Consult Note  Reason for Consult:  Alcoholic hepatitis   Attending Requesting Consult: Dr. Darvin Neighbours  History of Present Illness: Rhonda Fuentes is a 50 y.o. female who reports that she started having weakness at the beginning of the year.  She reports having the flu and was off of her thyroid meds and she attributed her weakness to that.  She reports she also started having numbness in her fingers and swelling in her feet around the same time.  She does report a significant alcohol intake.  She drinks (3) 12 ounces glasses of mixed drinks, vodka every night since 2011.  She reports she stopped drinking 10 days ago, stopped taking all medications except her thyroid.  She saw a new PCP in 05/2015 and her weight at that time was 174.  On admission it was 202.  She reports she does not eat very much, has no appetite.  She has never had anything like this before.  She reports taking 4-5 ibuprofen daily over the past year. Denies any heartburn or acid reflux.  She denies any abdominal pain, chest pain or SOB.  She has a history of gastric bypass in 2002, had a choly at the same time.  She thinks she may have had an EGD prior to that procedure.    She has never had a colonoscopy.  She denies any family history of colon or rectal cancer.   Past Medical History:  Past Medical History  Diagnosis Date  . Anemia   . Thyroid disease   . Alcohol abuse     Problem List: Patient Active Problem List   Diagnosis Date Noted  . Alcoholic hepatitis with ascites 07/15/2015    Past Surgical History: Past Surgical History  Procedure Laterality Date  . Cholecystectomy    . Gastric bypass    . Ankle surgery      Allergies: No Known Allergies  Home Medications: Prescriptions prior to admission  Medication Sig Dispense Refill Last Dose  . levothyroxine (SYNTHROID, LEVOTHROID) 50 MCG tablet Take 1 tablet by mouth daily.  2 07/14/2015 at Unknown time  . traZODone (DESYREL) 50 MG tablet  Take 1 tablet by mouth at bedtime.  2 07/14/2015 at Unknown time   Home medication reconciliation was completed with the patient.   Scheduled Inpatient Medications:   . folic acid  1 mg Oral Daily  . lactulose  30 g Oral BID  . levothyroxine  50 mcg Oral QAC breakfast  . pantoprazole  40 mg Oral Daily  . sodium chloride flush  3 mL Intravenous Q12H  . thiamine  100 mg Oral Daily    Continuous Inpatient Infusions:     PRN Inpatient Medications:  sodium chloride, ondansetron **OR** ondansetron (ZOFRAN) IV, oxyCODONE, sodium chloride flush  Family History: family history includes Breast cancer in her sister.  T  Social History:   reports that she has never smoked. She does not have any smokeless tobacco history on file. She reports that she drinks about 12.0 oz of alcohol per week. She reports that she uses illicit drugs (Marijuana).   Review of Systems: Constitutional: Weight gain of 28 pounds since 05/26/2015 Eyes: No changes in vision. ENT: No oral lesions, sore throat.  GI: see HPI.  Heme/Lymph: No easy bruising.  CV: No chest pain.  GU: No hematuria.  Integumentary: No rashes.  Neuro: No headaches.  Psych: No depression/anxiety.  Endocrine: No heat/cold intolerance.  Allergic/Immunologic: No urticaria.  Resp: No cough, SOB.  Musculoskeletal: No joint  swelling.    Physical Examination: BP 98/61 mmHg  Pulse 97  Temp(Src) 97.8 F (36.6 C) (Oral)  Resp 18  Ht 5' 8"  (1.727 m)  Wt 93.804 kg (206 lb 12.8 oz)  BMI 31.45 kg/m2  SpO2 97%  LMP 10/21/2014 (Approximate) Gen: NAD, alert and oriented x 4, very pleasant, jaundiced  HEENT: scler icterus noted Neck: supple, no JVD or thyromegaly Chest: CTA bilaterally, no wheezes, crackles, or other adventitious sounds CV: RRR, no m/g/c/r Abd: soft, NT, ND, +BS in all four quadrants; no HSM, guarding, ridigity, or rebound tenderness Ext: 2+ edema bilateral lower extremeties Skin: lesion on nose noted Lymph: no  LAD  Data: Lab Results  Component Value Date   WBC 7.9 07/16/2015   HGB 11.9* 07/16/2015   HCT 34.6* 07/16/2015   MCV 124.4* 07/16/2015   PLT 133* 07/16/2015    Recent Labs Lab 07/15/15 1050 07/16/15 0425  HGB 14.2 11.9*   Lab Results  Component Value Date   NA 129* 07/16/2015   K 3.2* 07/16/2015   CL 98* 07/16/2015   CO2 23 07/16/2015   BUN 5* 07/16/2015   CREATININE 0.39* 07/16/2015   Lab Results  Component Value Date   ALT 52 07/16/2015   AST 160* 07/16/2015   ALKPHOS 161* 07/16/2015   BILITOT 13.6* 07/16/2015    Recent Labs Lab 07/16/15 0425  INR 2.23    Imaging: CLINICAL DATA: Alcoholic hepatitis with ascites.  EXAM: US ABDOMEN LIMITED - RIGHT UPPER QUADRANT  COMPARISON: None.  FINDINGS: Gallbladder:  Status post cholecystectomy.  Common bile duct:  Diameter: 7 mm which is within normal limits for post cholecystectomy status.  Liver:  Diffusely increased echogenicity is noted without definite focal abnormality. Minimal amount of free fluid is seen anterior to the liver.  IMPRESSION: Status post cholecystectomy.  Diffusely increased echogenicity of hepatic parenchyma is noted which may represent fatty infiltration or other diffuse hepatocellular disease. Minimal free fluid is seen anterior to the liver. Possible nodular contours are seen anteriorly in the left hepatic lobe which may represent cirrhosis. Clinical correlation is recommended.   Electronically Signed  By: Marijo Conception, M.D.  On: 07/15/2015 14:09  Assessment/Plan: Ms. Petrey is a 49 y.o. female with alcoholic hepatitis, excessive long term alcohol abuse. Neg hep A,B,C labs.  Bili 18.4 on admission, 13.6 currently, AST 160, alt 52, Alk Phos 161.  Ammonia 50 on admission,currently 70. Albumin 2.0, LDH 569, TSH WNL, PT/INR 24.5/2.23.  Hgb 14.2 on admission, currently 11.9, MCV 124.4, platelets 133  Currently treated with lactulose, has had three BM's  today; Protonix 40 mg once daily  Recommendations: We recommend a CT ab/pel  and AFP for further evaluation.  Please see Dr. Percell Boston note for any further recommendations.  We will continue to follow with you. Thank you for the consult. Please call with questions or concerns.  Salvadore Farber, PA-C  I personally performed these services.

## 2015-07-16 NOTE — Consult Note (Signed)
See full consult by Sung Amabileari Richards.  Pt alcoholic with serious elevated LFT's but coming down (bilirubin)  Discussed fact with her that she likely has less than 2 years to live if she continues to drink.  Discussed going to AA when she leaves hospital.  Will follow with you.

## 2015-07-17 LAB — AFP TUMOR MARKER: AFP-Tumor Marker: 4.1 ng/mL (ref 0.0–8.3)

## 2015-07-17 LAB — COMPREHENSIVE METABOLIC PANEL
ALT: 52 U/L (ref 14–54)
ANION GAP: 8 (ref 5–15)
AST: 148 U/L — ABNORMAL HIGH (ref 15–41)
Albumin: 1.8 g/dL — ABNORMAL LOW (ref 3.5–5.0)
Alkaline Phosphatase: 164 U/L — ABNORMAL HIGH (ref 38–126)
BILIRUBIN TOTAL: 13.4 mg/dL — AB (ref 0.3–1.2)
BUN: <5 mg/dL — ABNORMAL LOW (ref 6–20)
CO2: 24 mmol/L (ref 22–32)
Calcium: 7.7 mg/dL — ABNORMAL LOW (ref 8.9–10.3)
Chloride: 96 mmol/L — ABNORMAL LOW (ref 101–111)
Creatinine, Ser: <0.3 mg/dL — ABNORMAL LOW (ref 0.44–1.00)
Glucose, Bld: 175 mg/dL — ABNORMAL HIGH (ref 65–99)
POTASSIUM: 3.6 mmol/L (ref 3.5–5.1)
Sodium: 128 mmol/L — ABNORMAL LOW (ref 135–145)
TOTAL PROTEIN: 5.1 g/dL — AB (ref 6.5–8.1)

## 2015-07-17 LAB — PROTIME-INR
INR: 2.27
PROTHROMBIN TIME: 24.8 s — AB (ref 11.4–15.0)

## 2015-07-17 MED ORDER — ALUM & MAG HYDROXIDE-SIMETH 200-200-20 MG/5ML PO SUSP
15.0000 mL | ORAL | Status: DC | PRN
  Administered 2015-07-18: 15 mL via ORAL
  Filled 2015-07-17 (×2): qty 30

## 2015-07-17 MED ORDER — TRAZODONE HCL 100 MG PO TABS
100.0000 mg | ORAL_TABLET | Freq: Every day | ORAL | Status: DC
  Administered 2015-07-17: 100 mg via ORAL
  Filled 2015-07-17: qty 1

## 2015-07-17 NOTE — Consult Note (Signed)
GI Inpatient Follow-up Note  Patient Identification: Rhonda Fuentes is a 50 y.o. female  Subjective:  Scheduled Inpatient Medications:  . folic acid  1 mg Oral Daily  . lactulose  30 g Oral BID  . levothyroxine  50 mcg Oral QAC breakfast  . methylPREDNISolone (SOLU-MEDROL) injection  125 mg Intravenous Daily  . pantoprazole  40 mg Oral Daily  . sodium chloride flush  3 mL Intravenous Q12H  . thiamine  100 mg Oral Daily    Continuous Inpatient Infusions:     PRN Inpatient Medications:  sodium chloride, alum & mag hydroxide-simeth, ondansetron **OR** ondansetron (ZOFRAN) IV, oxyCODONE, sodium chloride flush  Review of Systems: Constitutional: Weight is stable.  Eyes: No changes in vision. ENT: No oral lesions, sore throat.  GI: see HPI.  Heme/Lymph: No easy bruising.  CV: No chest pain.  GU: No hematuria.  Integumentary: No rashes.  Neuro: No headaches.  Psych: No depression/anxiety.  Endocrine: No heat/cold intolerance.  Allergic/Immunologic: No urticaria.  Resp: No cough, SOB.  Musculoskeletal: No joint swelling.    Physical Examination: BP 99/64 mmHg  Pulse 92  Temp(Src) 97.7 F (36.5 C) (Oral)  Resp 18  Ht 5' 8"  (1.727 m)  Wt 96.072 kg (211 lb 12.8 oz)  BMI 32.21 kg/m2  SpO2 98%  LMP 10/21/2014 (Approximate) Gen: NAD, alert and oriented x 4 HEENT: PEERLA, EOMI, Neck: supple, no JVD or thyromegaly Chest: CTA bilaterally, no wheezes, crackles, or other adventitious sounds CV: RRR, no m/g/c/r Abd: soft, NT, ND, +BS in all four quadrants; no HSM, guarding, ridigity, or rebound tenderness Ext: no edema, well perfused with 2+ pulses, Skin: no rash or lesions noted Lymph: no LAD  Data: Lab Results  Component Value Date   WBC 7.9 07/16/2015   HGB 11.9* 07/16/2015   HCT 34.6* 07/16/2015   MCV 124.4* 07/16/2015   PLT 133* 07/16/2015    Recent Labs Lab 07/15/15 1050 07/16/15 0425  HGB 14.2 11.9*   Lab Results  Component Value Date   NA 128*  07/17/2015   K 3.6 07/17/2015   CL 96* 07/17/2015   CO2 24 07/17/2015   BUN <5* 07/17/2015   CREATININE <0.30* 07/17/2015   Lab Results  Component Value Date   ALT 52 07/17/2015   AST 148* 07/17/2015   ALKPHOS 164* 07/17/2015   BILITOT 13.4* 07/17/2015    Recent Labs Lab 07/17/15 0427  INR 2.27   Imaging: CLINICAL DATA: Alcoholic hepatitis with ascites and jaundice.  EXAM: CT ABDOMEN AND PELVIS WITH CONTRAST  TECHNIQUE: Multidetector CT imaging of the abdomen and pelvis was performed using the standard protocol following bolus administration of intravenous contrast.  CONTRAST: 131m ISOVUE-300 IOPAMIDOL (ISOVUE-300) INJECTION 61%  COMPARISON: Right upper quadrant abdominal ultrasound on 07/15/2015  FINDINGS: Lower chest: The visualized lung bases show streaky atelectasis and scarring bilaterally.  Hepatobiliary: The liver is enlarged with the right lobe measuring approximately 23 cm in length. Parenchyma demonstrates severe steatosis. There are areas of more severe geographic steatosis. At the dome of the liver a vague area of increased nodular density measures approximately 1.1 cm. This may be a relatively spared area from steatosis. Subtle lesion cannot be excluded. No evidence of biliary ductal dilatation. The gallbladder has been removed.  Pancreas: No mass, inflammatory changes, or other significant abnormality.  Spleen: No splenomegaly.  Adrenals/Urinary Tract: Kidneys and adrenal glands appear normal. No hydronephrosis. The bladder is decompressed.  Stomach/Bowel: There is a segment of irregular thickening of the ascending colon. The rest  the colon appears unremarkable and differential considerations include segmental colitis or potentially colonic mass. There is some adjacent ascites which may accentuate wall thickness. This does appear to be abnormal, however. The small bowel is normal. No free intraperitoneal  air.  Vascular/Lymphatic: There are varices of the distal esophagus with the largest measuring approximately 9 mm in diameter. Some small gastric varices are also suspected. There is a visible open left gastric vein supplying the varices. No identifiable gastro-renal shunt or splenorenal shunt. No evidence of portal vein thrombus. Splenic and superior mesenteric veins are normally patent. No enlarged lymph nodes are seen. Small amount of ascites in the abdomen and pelvis. No evidence of focal abscess.  Reproductive: Uterus and adnexal regions appear unremarkable.  Other: No hernias are identified.  Musculoskeletal: No suspicious bone lesions identified.  IMPRESSION: 1. Severe steatosis of the liver with associated hepatomegaly and evidence of portal hypertension. Small amount of ascites present as well as esophageal and gastroesophageal varices. Small nodular area at the dome of the liver measuring 1.1 cm and may be an area of relative fatty sparing versus true enhancing nodule. MRI of the abdomen with and without gadolinium would be helpful for further evaluation. 2. Irregular thickening of the ascending colon. This may represent colitis. Hit tumor of the colon is not excluded and ultimate correlation with colonoscopy may be helpful unless this resolves by imaging.   Electronically Signed  By: Aletta Edouard M.D.  On: 07/16/2015 18:07  Assessment/Plan: [name] is a 50 y.o. female alcoholic hepatitis / cirrhosis with ascites.  Worsening bilateral lower pitting edema. Bili increase 13.4, AST 148, ALT 52, Alk phos 164, Sodium 128.     Recommendations: We recommend 20 mg Lasix and 50 mg of Aldactone due to increasing edema.  She will need to be started on nadalol, but she is currently hypotensive.  Continue to treat nausea. Recommend CEA.  Will schedule her for colonoscopy and EGD in a few weeks.  We will be repeating imaging as outpatient, no need for MRI while  inpatient. We agree with solu-medrol.   We will continue to follow with you.  Please call with questions or concerns.  Salvadore Farber, PA-C  I personally performed these services.

## 2015-07-17 NOTE — Progress Notes (Signed)
[facility] - [place] at [facility]   PATIENT NAME: Rhonda Fuentes    MR#:  [id number]  DATE OF BIRTH:  Aug 30, 1965  SUBJECTIVE:  CHIEF COMPLAINT:   Chief Complaint  Patient presents with  . Cough  . Leg Swelling  . Weakness   - admitted with jaundice and noted to have acute alcoholic hepatitis - still elevated bili, complains of weakness, loss of appetite and nausea today  REVIEW OF SYSTEMS:  Review of Systems  Constitutional: Positive for malaise/fatigue. Negative for fever and chills.  HENT: Negative for ear discharge and ear pain.   Eyes: Negative for blurred vision and double vision.  Respiratory: Negative for cough, shortness of breath and wheezing.   Cardiovascular: Negative for chest pain, palpitations and leg swelling.  Gastrointestinal: Positive for nausea and abdominal pain. Negative for vomiting, diarrhea and constipation.  Genitourinary: Negative for dysuria and urgency.  Musculoskeletal: Positive for myalgias and back pain.  Neurological: Negative for dizziness, sensory change, speech change, focal weakness, seizures and headaches.  Psychiatric/Behavioral: Negative for depression. The patient has insomnia.     DRUG ALLERGIES:  No Known Allergies  VITALS:  Blood pressure 99/64, pulse 92, temperature 97.7 F (36.5 C), temperature source Oral, resp. rate 18, height  (1.727 m), weight 96.072 kg (211 lb 12.8 oz), last menstrual period 10/21/2014, SpO2 98 %.  PHYSICAL EXAMINATION:  Physical Exam  GENERAL:  49 y.o.-year-old patient lying in the bed with no acute distress. Icteric looking sclerae. EYES: Pupils equal, round, reactive to light and accommodation. Significant  scleral icterus. Extraocular muscles intact.  HEENT: Head atraumatic, normocephalic. Oropharynx and nasopharynx clear.  NECK:  Supple, no jugular venous distention. No thyroid enlargement, no tenderness.  LUNGS: Normal breath sounds bilaterally, no wheezing,  rales,rhonchi or crepitation. No use of accessory muscles of respiration.  CARDIOVASCULAR: S1, S2 normal. No murmurs, rubs, or gallops.  ABDOMEN: Soft, tenderness on palpation in epigastric, nondistended. Bowel sounds present. No organomegaly or mass.  EXTREMITIES: No pedal edema, cyanosis, or clubbing.  NEUROLOGIC: Cranial nerves II through XII are intact. Muscle strength 5/5 in all extremities. Sensation intact. Gait not checked.  PSYCHIATRIC: The patient is alert and oriented x 3.  SKIN: No obvious rash, lesion, or ulcer.    LABORATORY PANEL:   CBC  Recent Labs Lab 07/16/15 0425  WBC 7.9  HGB 11.9*  HCT 34.6*  PLT 133*   ------------------------------------------------------------------------------------------------------------------  Chemistries   Recent Labs Lab 07/17/15 0427  NA 128*  K 3.6  CL 96*  CO2 24  GLUCOSE 175*  BUN <5*  CREATININE <0.30*  CALCIUM 7.7*  AST 148*  ALT 52  ALKPHOS 164*  BILITOT 13.4*   ------------------------------------------------------------------------------------------------------------------  Cardiac Enzymes  Recent Labs Lab 07/15/15 1050  TROPONINI 0.03   ------------------------------------------------------------------------------------------------------------------  RADIOLOGY:  Ct Abdomen Pelvis W Contrast  07/16/2015  CLINICAL DATA:  Alcoholic hepatitis with ascites and jaundice. EXAM: CT ABDOMEN AND PELVIS WITH CONTRAST TECHNIQUE: Multidetector CT imaging of the abdomen and pelvis was performed using the standard protocol following bolus administration of intravenous contrast. CONTRAST:  ISOVUE-300 IOPAMIDOL (ISOVUE-300) INJECTION 61% COMPARISON:  Right upper quadrant abdominal ultrasound on 07/15/2015 FINDINGS: Lower chest: The visualized lung bases show streaky atelectasis and scarring bilaterally. Hepatobiliary: The liver is enlarged with the right lobe measuring approximately 23 cm in length. Parenchyma  demonstrates severe steatosis. There are areas of more severe geographic steatosis. At the dome of the liver a vague area of increased nodular density measures approximately 1.1  cm. This may be a relatively spared area from steatosis. Subtle lesion cannot be excluded. No evidence of biliary ductal dilatation. The gallbladder has been removed. Pancreas: No mass, inflammatory changes, or other significant abnormality. Spleen: No splenomegaly. Adrenals/Urinary Tract: Kidneys and adrenal glands appear normal. No hydronephrosis. The bladder is decompressed. Stomach/Bowel: There is a segment of irregular thickening of the ascending colon. The rest the colon appears unremarkable and differential considerations include segmental colitis or potentially colonic mass. There is some adjacent ascites which may accentuate wall thickness. This does appear to be abnormal, however. The small bowel is normal. No free intraperitoneal air. Vascular/Lymphatic: There are varices of the distal esophagus with the largest measuring approximately 9 mm in diameter. Some small gastric varices are also suspected. There is a visible open left gastric vein supplying the varices. No identifiable gastro-renal shunt or splenorenal shunt. No evidence of portal vein thrombus. Splenic and superior mesenteric veins are normally patent. No enlarged lymph nodes are seen. Small amount of ascites in the abdomen and pelvis. No evidence of focal abscess. Reproductive: Uterus and adnexal regions appear unremarkable. Other: No hernias are identified. Musculoskeletal:  No suspicious bone lesions identified. IMPRESSION: 1. Severe steatosis of the liver with associated hepatomegaly and evidence of portal hypertension. Small amount of ascites present as well as esophageal and gastroesophageal varices. Small nodular area at the dome of the liver measuring 1.1 cm and may be an area of relative fatty sparing versus true enhancing nodule. MRI of the abdomen with and  without gadolinium would be helpful for further evaluation. 2. Irregular thickening of the ascending colon. This may represent colitis. Hit tumor of the colon is not excluded and ultimate correlation with colonoscopy may be helpful unless this resolves by imaging. Electronically Signed   By: Irish LackGlenn  Yamagata M.D.   On: 07/16/2015 18:07   [place] Abdomen Limited Ruq  07/15/2015  CLINICAL DATA:  Alcoholic hepatitis with ascites. EXAM: US ABDOMEN LIMITED - RIGHT UPPER QUADRANT COMPARISON:  None. FINDINGS: Gallbladder: Status post cholecystectomy. Common bile duct: Diameter: 7 mm which is within normal limits for post cholecystectomy status. Liver: Diffusely increased echogenicity is noted without definite focal abnormality. Minimal amount of free fluid is seen anterior to the liver. IMPRESSION: Status post cholecystectomy. Diffusely increased echogenicity of hepatic parenchyma is noted which may represent fatty infiltration or other diffuse hepatocellular disease. Minimal free fluid is seen anterior to the liver. Possible nodular contours are seen anteriorly in the left hepatic lobe which may represent cirrhosis. Clinical correlation is recommended. Electronically Signed   By: Lupita RaiderJames  Green Jr, M.D.   On: 07/15/2015 14:09    EKG:  No orders found for this or any previous visit.  ASSESSMENT AND PLAN:   Active Problems:  Alcoholic hepatitis with ascites  * Acute alcoholic hepatitis high indirect bilirubin & INR. Acute hepatitis panel sent- negative. CT abd with cirrhosis changes of liver, slightly elevated AFP - cont to monitor bili As INR is also high- started on steroids for acute alcoholic hepatitis - GI consult appreciated. Protonix for GI prophylaxis  * Hypovolemic hyponatremia- from alcoholism Gentle hydration and monitor.  * Elevated ammonia due to liver disease Started lactulose  * Hypothyroidism Continue levothyroxine  * DVT prophylaxis SCDs. INR is high   Ambulating in  hallway   All the records are reviewed and case discussed with Care Management/Social Workerr. Management plans discussed with the patient, family and they are in agreement.  CODE STATUS: Full Code  TOTAL TIME TAKING CARE OF  THIS PATIENT: 37 minutes.   POSSIBLE D/C IN 2 DAYS, DEPENDING ON CLINICAL CONDITION.   Enid Baas M.D on 07/17/2015 at 12:35 PM  Between 7am to 6pm - Pager - [phone]  After 6pm go to [email or web address] - password [email or web address] [facility]  [facility]  Office  [phone]  CC: Primary care physician; No PCP Per Patient

## 2015-07-18 LAB — COMPREHENSIVE METABOLIC PANEL
ALBUMIN: 2 g/dL — AB (ref 3.5–5.0)
ALK PHOS: 168 U/L — AB (ref 38–126)
ALT: 53 U/L (ref 14–54)
AST: 136 U/L — ABNORMAL HIGH (ref 15–41)
Anion gap: 7 (ref 5–15)
BUN: 7 mg/dL (ref 6–20)
CALCIUM: 8 mg/dL — AB (ref 8.9–10.3)
CHLORIDE: 97 mmol/L — AB (ref 101–111)
CO2: 26 mmol/L (ref 22–32)
CREATININE: 0.44 mg/dL (ref 0.44–1.00)
GFR calc non Af Amer: >60 mL/min (ref >60)
Glucose, Bld: 125 mg/dL — ABNORMAL HIGH (ref 65–99)
Potassium: 4.2 mmol/L (ref 3.5–5.1)
SODIUM: 130 mmol/L — AB (ref 135–145)
TOTAL PROTEIN: 5.4 g/dL — AB (ref 6.5–8.1)
Total Bilirubin: 10.4 mg/dL — ABNORMAL HIGH (ref 0.3–1.2)

## 2015-07-18 LAB — LIPASE, BLOOD: LIPASE: 15 U/L (ref 11–51)

## 2015-07-18 MED ORDER — SPIRONOLACTONE 25 MG PO TABS
25.0000 mg | ORAL_TABLET | Freq: Every day | ORAL | Status: DC
  Administered 2015-07-18 – 2015-07-19 (×2): 25 mg via ORAL
  Filled 2015-07-18 (×2): qty 1

## 2015-07-18 MED ORDER — FUROSEMIDE 20 MG PO TABS
20.0000 mg | ORAL_TABLET | Freq: Every day | ORAL | Status: DC
  Administered 2015-07-18 – 2015-07-19 (×2): 20 mg via ORAL
  Filled 2015-07-18 (×2): qty 1

## 2015-07-18 MED ORDER — GABAPENTIN 800 MG PO TABS
400.0000 mg | ORAL_TABLET | Freq: Every day | ORAL | Status: DC
Start: 2015-07-18 — End: 2015-07-18
  Filled 2015-07-18: qty 0.5

## 2015-07-18 MED ORDER — ZOLPIDEM TARTRATE 5 MG PO TABS: 5.0000 mg | ORAL_TABLET | Freq: Every evening | ORAL | Status: DC | PRN

## 2015-07-18 MED ORDER — TRAZODONE HCL 50 MG PO TABS
50.0000 mg | ORAL_TABLET | Freq: Every day | ORAL | Status: DC
  Administered 2015-07-18: 50 mg via ORAL
  Filled 2015-07-18: qty 1

## 2015-07-18 MED ORDER — NON FORMULARY: 1.0000 mg | Freq: Every evening | Status: DC | PRN

## 2015-07-18 MED ORDER — GABAPENTIN 400 MG PO CAPS
400.0000 mg | ORAL_CAPSULE | Freq: Every day | ORAL | Status: DC
  Administered 2015-07-18: 400 mg via ORAL
  Filled 2015-07-18: qty 1

## 2015-07-18 NOTE — Consult Note (Signed)
Patient TB down to 10 from 18 on admission.  No vomiting.  No new complaints.  I would cut solumedrol down to 60 a day.  Will likely go home on 2-3 week taper of prednisone.  Discussed ways to decrease chance of relapse.  No new suggestions.  Her BP is too low at this time to start beta blocker for decrease of portal hypertension.  I will follow her after discharge unless she has a primary doctor she prefers to see.

## 2015-07-18 NOTE — Progress Notes (Signed)
[facility] - [place] at [facility]   PATIENT NAME: Rhonda Fuentes    MR#:  [id number]  DATE OF BIRTH:  November 12, 1965  SUBJECTIVE:  CHIEF COMPLAINT:   Chief Complaint  Patient presents with  . Cough  . Leg Swelling  . Weakness   - Complains of insomnia which is chronic and also low back pain. -Bilirubin is improving, down to 10 at this time -Nausea is better, but still has epigastric bloating and pain.  REVIEW OF SYSTEMS:  Review of Systems  Constitutional: Positive for malaise/fatigue. Negative for fever and chills.  HENT: Negative for ear discharge and ear pain.   Eyes: Negative for blurred vision and double vision.  Respiratory: Negative for cough, shortness of breath and wheezing.   Cardiovascular: Negative for chest pain, palpitations and leg swelling.  Gastrointestinal: Positive for abdominal pain. Negative for nausea, vomiting, diarrhea and constipation.  Genitourinary: Negative for dysuria and urgency.  Musculoskeletal: Positive for myalgias and back pain.  Neurological: Negative for dizziness, sensory change, speech change, focal weakness, seizures and headaches.  Psychiatric/Behavioral: Negative for depression. The patient has insomnia.     DRUG ALLERGIES:  No Known Allergies  VITALS:  Blood pressure 85/50, pulse 92, temperature 98.2 F (36.8 C), temperature source Oral, resp. rate 18, height  (1.727 m), weight 93.072 kg (205 lb 3 oz), last menstrual period 10/21/2014, SpO2 94 %.  PHYSICAL EXAMINATION:  Physical Exam  GENERAL:  50 y.o.-year-old patient lying in the bed with no acute distress. Icteric looking sclerae. EYES: Pupils equal, round, reactive to light and accommodation. Significant  scleral icterus. Extraocular muscles intact.  HEENT: Head atraumatic, normocephalic. Oropharynx and nasopharynx clear.  NECK:  Supple, no jugular venous distention. No thyroid enlargement, no tenderness.  LUNGS: Normal breath sounds bilaterally,  no wheezing, rales,rhonchi or crepitation. No use of accessory muscles of respiration.  CARDIOVASCULAR: S1, S2 normal. No murmurs, rubs, or gallops.  ABDOMEN: Soft, tenderness on palpation in epigastric, nondistended. Bowel sounds present. No organomegaly or mass.  EXTREMITIES: No pedal edema, cyanosis, or clubbing.  NEUROLOGIC: Cranial nerves II through XII are intact. Muscle strength 5/5 in all extremities. Sensation intact. Gait not checked.  PSYCHIATRIC: The patient is alert and oriented x 3.  SKIN: No obvious rash, lesion, or ulcer.    LABORATORY PANEL:   CBC  Recent Labs Lab 07/16/15 0425  WBC 7.9  HGB 11.9*  HCT 34.6*  PLT 133*   ------------------------------------------------------------------------------------------------------------------  Chemistries   Recent Labs Lab 07/18/15 0429  NA 130*  K 4.2  CL 97*  CO2 26  GLUCOSE 125*  BUN 7  CREATININE 0.44  CALCIUM 8.0*  AST 136*  ALT 53  ALKPHOS 168*  BILITOT 10.4*   ------------------------------------------------------------------------------------------------------------------  Cardiac Enzymes  Recent Labs Lab 07/15/15 1050  TROPONINI 0.03   ------------------------------------------------------------------------------------------------------------------  RADIOLOGY:  Ct Abdomen Pelvis W Contrast  07/16/2015  CLINICAL DATA:  Alcoholic hepatitis with ascites and jaundice. EXAM: CT ABDOMEN AND PELVIS WITH CONTRAST TECHNIQUE: Multidetector CT imaging of the abdomen and pelvis was performed using the standard protocol following bolus administration of intravenous contrast. CONTRAST:  ISOVUE-300 IOPAMIDOL (ISOVUE-300) INJECTION 61% COMPARISON:  Right upper quadrant abdominal ultrasound on 07/15/2015 FINDINGS: Lower chest: The visualized lung bases show streaky atelectasis and scarring bilaterally. Hepatobiliary: The liver is enlarged with the right lobe measuring approximately 23 cm in length. Parenchyma  demonstrates severe steatosis. There are areas of more severe geographic steatosis. At the dome of the liver a vague area of  increased nodular density measures approximately 1.1 cm. This may be a relatively spared area from steatosis. Subtle lesion cannot be excluded. No evidence of biliary ductal dilatation. The gallbladder has been removed. Pancreas: No mass, inflammatory changes, or other significant abnormality. Spleen: No splenomegaly. Adrenals/Urinary Tract: Kidneys and adrenal glands appear normal. No hydronephrosis. The bladder is decompressed. Stomach/Bowel: There is a segment of irregular thickening of the ascending colon. The rest the colon appears unremarkable and differential considerations include segmental colitis or potentially colonic mass. There is some adjacent ascites which may accentuate wall thickness. This does appear to be abnormal, however. The small bowel is normal. No free intraperitoneal air. Vascular/Lymphatic: There are varices of the distal esophagus with the largest measuring approximately 9 mm in diameter. Some small gastric varices are also suspected. There is a visible open left gastric vein supplying the varices. No identifiable gastro-renal shunt or splenorenal shunt. No evidence of portal vein thrombus. Splenic and superior mesenteric veins are normally patent. No enlarged lymph nodes are seen. Small amount of ascites in the abdomen and pelvis. No evidence of focal abscess. Reproductive: Uterus and adnexal regions appear unremarkable. Other: No hernias are identified. Musculoskeletal:  No suspicious bone lesions identified. IMPRESSION: 1. Severe steatosis of the liver with associated hepatomegaly and evidence of portal hypertension. Small amount of ascites present as well as esophageal and gastroesophageal varices. Small nodular area at the dome of the liver measuring 1.1 cm and may be an area of relative fatty sparing versus true enhancing nodule. MRI of the abdomen with and  without gadolinium would be helpful for further evaluation. 2. Irregular thickening of the ascending colon. This may represent colitis. Hit tumor of the colon is not excluded and ultimate correlation with colonoscopy may be helpful unless this resolves by imaging. Electronically Signed   By: Irish LackGlenn  Yamagata M.D.   On: 07/16/2015 18:07    EKG:  No orders found for this or any previous visit.  ASSESSMENT AND PLAN:   Active Problems:  Alcoholic hepatitis with ascites  * Acute alcoholic hepatitis high indirect bilirubin & INR. Acute hepatitis panel sent- negative. CT abd with cirrhosis changes of liver, slightly elevated AFP - cont to monitor bili-improving. Bilirubin down from 18 on admission to 10 today -Continue IV Solu-Medrol for now -Will need EGD and colonoscopy and repeat MRI abdomen as outpatient - GI consult appreciated. Protonix for GI prophylaxis  * Hypovolemic hyponatremia- from alcoholism Improving. Lasix started today  * Elevated ammonia due to liver disease Continue lactulose  * Hypothyroidism Continue levothyroxine  * alcoholic liver cirrhosis-stopped alcohol use for the last 2 weeks -Started on Lasix and Aldactone today. Nadolol will be started if blood pressure allows.  * Chronic Insomnia- restarted lunesta and gabapentin at bedtime. Discontinue trazodone  * DVT prophylaxis SCDs. INR is high   Ambulating in hallway   All the records are reviewed and case discussed with Care Management/Social Workerr. Management plans discussed with the patient, family and they are in agreement.  CODE STATUS: Full Code  TOTAL TIME TAKING CARE OF THIS PATIENT: 37 minutes.   POSSIBLE D/C IN 2 DAYS, DEPENDING ON CLINICAL CONDITION.   Enid BaasKALISETTI,Jovi Alvizo M.D on 07/18/2015 at 8:46 AM  Between 7am to 6pm - Pager - [phone]  After 6pm go to [email or web address] - password [email or web address] [facility]  [facility]  Office  [phone]  CC: Primary care physician; No PCP Per  Patient

## 2015-07-18 NOTE — Progress Notes (Signed)
MD notified of pt request for trazadone reordered for sleep d/t lunesta not in pharmacy. Willis MD to put new orders in and discontinue [place]

## 2015-07-19 LAB — COMPREHENSIVE METABOLIC PANEL
ALK PHOS: 183 U/L — AB (ref 38–126)
ALT: 61 U/L — AB (ref 14–54)
AST: 162 U/L — AB (ref 15–41)
Albumin: 2 g/dL — ABNORMAL LOW (ref 3.5–5.0)
Anion gap: 6 (ref 5–15)
BILIRUBIN TOTAL: 9.2 mg/dL — AB (ref 0.3–1.2)
BUN: 10 mg/dL (ref 6–20)
CHLORIDE: 98 mmol/L — AB (ref 101–111)
CO2: 29 mmol/L (ref 22–32)
CREATININE: 0.53 mg/dL (ref 0.44–1.00)
Calcium: 8.1 mg/dL — ABNORMAL LOW (ref 8.9–10.3)
GFR calc Af Amer: >60 mL/min (ref >60)
Glucose, Bld: 124 mg/dL — ABNORMAL HIGH (ref 65–99)
Potassium: 4.2 mmol/L (ref 3.5–5.1)
Sodium: 133 mmol/L — ABNORMAL LOW (ref 135–145)
TOTAL PROTEIN: 5.5 g/dL — AB (ref 6.5–8.1)

## 2015-07-19 LAB — PROTIME-INR
INR: 1.73
PROTHROMBIN TIME: 20.2 s — AB (ref 11.4–15.0)

## 2015-07-19 LAB — CEA: CEA: 3.8 ng/mL (ref 0.0–4.7)

## 2015-07-19 MED ORDER — FUROSEMIDE 20 MG PO TABS: 20.0000 mg | ORAL_TABLET | Freq: Every day | ORAL | Status: DC

## 2015-07-19 MED ORDER — LACTULOSE 10 GM/15ML PO SOLN: 20.0000 g | Freq: Two times a day (BID) | ORAL | Status: DC

## 2015-07-19 MED ORDER — SPIRONOLACTONE 25 MG PO TABS: 25.0000 mg | ORAL_TABLET | Freq: Every day | ORAL | Status: DC

## 2015-07-19 MED ORDER — OXYCODONE HCL 5 MG PO TABS: 5.0000 mg | ORAL_TABLET | ORAL | Status: DC | PRN

## 2015-07-19 MED ORDER — THIAMINE HCL 100 MG PO TABS: 100.0000 mg | ORAL_TABLET | Freq: Every day | ORAL | Status: DC

## 2015-07-19 MED ORDER — ESZOPICLONE 1 MG PO TABS: 1.0000 mg | ORAL_TABLET | Freq: Every evening | ORAL | Status: DC | PRN

## 2015-07-19 MED ORDER — CYCLOBENZAPRINE HCL 5 MG PO TABS: 5.0000 mg | ORAL_TABLET | Freq: Three times a day (TID) | ORAL | Status: DC | PRN

## 2015-07-19 MED ORDER — FOLIC ACID 1 MG PO TABS: 1.0000 mg | ORAL_TABLET | Freq: Every day | ORAL | Status: AC

## 2015-07-19 MED ORDER — PANTOPRAZOLE SODIUM 40 MG PO TBEC: 40.0000 mg | DELAYED_RELEASE_TABLET | Freq: Every day | ORAL | Status: DC

## 2015-07-19 MED ORDER — DIPHENHYDRAMINE HCL 50 MG/ML IJ SOLN
12.5000 mg | Freq: Once | INTRAMUSCULAR | Status: AC
  Administered 2015-07-19: 12.5 mg via INTRAVENOUS
  Filled 2015-07-19: qty 1

## 2015-07-19 MED ORDER — PREDNISONE 10 MG (21) PO TBPK: 10.0000 mg | ORAL_TABLET | Freq: Every day | ORAL | Status: DC

## 2015-07-19 MED ORDER — GABAPENTIN 400 MG PO CAPS: 400.0000 mg | ORAL_CAPSULE | Freq: Every day | ORAL | Status: DC

## 2015-07-19 NOTE — Progress Notes (Signed)
DISCHARGE INSTRUCTIONS:  Pt given discharge instructions and prescriptions. Pt verbalized understanding.  Pt waiting on ride.

## 2015-07-19 NOTE — Progress Notes (Signed)
Pt wheeled to car by staff. 

## 2015-07-19 NOTE — Consult Note (Signed)
GI Inpatient Follow-up Note  Patient Identification: Lucia Bitterrinia Ortner is a 50 y.o. female with alcoholic hepatitis  Subjective: Patient reports she is feeling much better today.  Reports the swelling in her feet has gone down.  Has been urinating a lot, reports urine is starting to lighten up in color.  Had not had a BM today, but felt as one would occur soon.  Anxious to go home, has not rested well since admission.  Scheduled Inpatient Medications:  . folic acid  1 mg Oral Daily  . furosemide  20 mg Oral Daily  . gabapentin  400 mg Oral QHS  . lactulose  30 g Oral BID  . levothyroxine  50 mcg Oral QAC breakfast  . methylPREDNISolone (SOLU-MEDROL) injection  125 mg Intravenous Daily  . pantoprazole  40 mg Oral Daily  . sodium chloride flush  3 mL Intravenous Q12H  . spironolactone  25 mg Oral Daily  . thiamine  100 mg Oral Daily  . traZODone  50 mg Oral QHS    Continuous Inpatient Infusions:     PRN Inpatient Medications:  sodium chloride, alum & mag hydroxide-simeth, ondansetron **OR** ondansetron (ZOFRAN) IV, oxyCODONE, sodium chloride flush  Review of Systems: Constitutional: Weight is stable.  Eyes: No changes in vision. ENT: No oral lesions, sore throat.  GI: see HPI.  Heme/Lymph: No easy bruising.  CV: No chest pain.  GU: No hematuria.  Integumentary: No rashes.  Neuro: No headaches.  Psych: No depression/anxiety.  Endocrine: No heat/cold intolerance.  Allergic/Immunologic: No urticaria.  Resp: No cough, SOB.  Musculoskeletal: No joint swelling.    Physical Examination: BP 96/69 mmHg  Pulse 89  Temp(Src) 97.4 F (36.3 C) (Oral)  Resp 18  Ht 5\' 8"  (1.727 m)  Wt 93.441 kg (206 lb)  BMI 31.33 kg/m2  SpO2 96%  LMP 10/21/2014 (Approximate) Gen: NAD, alert and oriented x 4, jaundiced, but improving HEENT: sclera icterus noted Neck: supple, no JVD or thyromegaly Chest: CTA bilaterally, no wheezes, crackles, or other adventitious sounds CV: RRR, no  m/g/c/r Abd: soft, NT, slight distension +BS in all four quadrants; no HSM, guarding, ridigity, or rebound tenderness Ext: slight edema, well perfused with 2+ pulses, Skin: lesion under left nostril Lymph: no LAD  Data: Lab Results  Component Value Date   WBC 7.9 07/16/2015   HGB 11.9* 07/16/2015   HCT 34.6* 07/16/2015   MCV 124.4* 07/16/2015   PLT 133* 07/16/2015    Recent Labs Lab 07/15/15 1050 07/16/15 0425  HGB 14.2 11.9*   Lab Results  Component Value Date   NA 133* 07/19/2015   K 4.2 07/19/2015   CL 98* 07/19/2015   CO2 29 07/19/2015   BUN 10 07/19/2015   CREATININE 0.53 07/19/2015   Lab Results  Component Value Date   ALT 61* 07/19/2015   AST 162* 07/19/2015   ALKPHOS 183* 07/19/2015   BILITOT 9.2* 07/19/2015    Recent Labs Lab 07/19/15 0351  INR 1.73   Assessment/Plan: Ms. Lemar LoftyRhode is a 50 y.o. female with alcoholic hepatitis.  LFT's improving. CEA WNL.     Recommendations: No new recommendations at this time.  Patient was once again encouraged to follow up in office ASAP after d/c Please call with questions or concerns.  Carney Harderari S Richards, PA-C  I personally performed these services.

## 2015-07-19 NOTE — Progress Notes (Signed)
MD notified of pt c/o extremely anxious and cant sleep. Diamond MD to put in new order

## 2015-07-19 NOTE — Discharge Summary (Signed)
[facility] - [place] at [facility]   PATIENT NAME: Rhonda Fuentes    MR#:  [id number]  DATE OF BIRTH:  1966-01-22  DATE OF ADMISSION:  07/15/2015 ADMITTING PHYSICIAN: Milagros Loll, MD  DATE OF DISCHARGE: 07/19/2015  PRIMARY CARE PHYSICIAN: No PCP Per Patient    ADMISSION DIAGNOSIS:  Nutritional deficiency [E63.9] Acute liver failure [K72.00] Alcohol dependence in remission (HCC) [F10.21] Alcoholic hepatitis with ascites [K70.11]  DISCHARGE DIAGNOSIS:  Active Problems:   Alcoholic hepatitis with ascites   SECONDARY DIAGNOSIS:   Past Medical History  Diagnosis Date  . Anemia   . Thyroid disease   . Alcohol abuse     HOSPITAL COURSE:   Active Problems:  Alcoholic hepatitis with ascites  * Acute alcoholic hepatitis high indirect bilirubin & INR. Acute hepatitis panel sent- negative. CT abd with cirrhosis changes of liver, slightly elevated AFP -Bilirubin down from 18 on admission to 9 today -received IV steroids in the hospital. Will be discharged on a 2 week steroid taper -Will need EGD and colonoscopy and repeat MRI abdomen as outpatient - GI consult appreciated. Protonix for GI prophylaxis  * Hypovolemic hyponatremia- from alcoholism Improving. Lasix started  * Elevated ammonia due to liver disease Continue lactulose -not encephalopathic at discharge  * Hypothyroidism Continue levothyroxine  * alcoholic liver cirrhosis-stopped alcohol use for the last 2 weeks -continueon Lasix and Aldactone. Nadolol can be started as outpatient if blood pressure allows.  * Chronic Insomnia- restarted lunesta and gabapentin at bedtime.   Ambulating very well. Will be discharged home today  DISCHARGE CONDITIONS:   Guarded  CONSULTS OBTAINED:   GI consultation by Dr. Lynnae Prude  DRUG ALLERGIES:  No Known Allergies  DISCHARGE MEDICATIONS:   Current Discharge Medication List    START taking these medications   Details   cyclobenzaprine (FLEXERIL) 5 MG tablet Take 1 tablet (5 mg total) by mouth 3 (three) times daily as needed for muscle spasms. Qty: 30 tablet, Refills: 0    eszopiclone (LUNESTA) 1 MG TABS tablet Take 1 tablet (1 mg total) by mouth at bedtime as needed for sleep. Take immediately before bedtime Qty: 30 tablet, Refills: 0    folic acid (FOLVITE) 1 MG tablet Take 1 tablet (1 mg total) by mouth daily. Qty: 30 tablet, Refills: 2    furosemide (LASIX) 20 MG tablet Take 1 tablet (20 mg total) by mouth daily. Qty: 30 tablet, Refills: 2    gabapentin (NEURONTIN) 400 MG capsule Take 1 capsule (400 mg total) by mouth at bedtime. Qty: 30 capsule, Refills: 1    lactulose (CHRONULAC) 10 GM/15ML solution Take 30 mLs (20 g total) by mouth 2 (two) times daily. Qty: 240 mL, Refills: 0    oxyCODONE (OXY IR/ROXICODONE) 5 MG immediate release tablet Take 1 tablet (5 mg total) by mouth every 4 (four) hours as needed for moderate pain or severe pain. Qty: 20 tablet, Refills: 0    pantoprazole (PROTONIX) 40 MG tablet Take 1 tablet (40 mg total) by mouth daily. Qty: 30 tablet, Refills: 2    predniSONE (STERAPRED UNI-PAK 21 TAB) 10 MG (21) TBPK tablet Take 1 tablet (10 mg total) by mouth daily. 6 tabs PO x 2 days 5 tabs PO x 2 days 4 tabs PO x 2 days 3 tabs PO x 2 days 2 tabs PO x 2 days 1 tab PO x 2 days and stop Qty: 42 tablet, Refills: 0    spironolactone (ALDACTONE) 25 MG tablet Take 1 tablet (  25 mg total) by mouth daily. Qty: 30 tablet, Refills: 2    thiamine 100 MG tablet Take 1 tablet (100 mg total) by mouth daily. Qty: 30 tablet, Refills: 2      CONTINUE these medications which have NOT CHANGED   Details  levothyroxine (SYNTHROID, LEVOTHROID) 50 MCG tablet Take 1 tablet by mouth daily. Refills: 2      STOP taking these medications     traZODone (DESYREL) 50 MG tablet          DISCHARGE INSTRUCTIONS:   1. GI follow up in 1 week. -Will need outpatient EGD and colonoscopy 2. CMP  follow-up in 1 week 3. PCP follow-up in 1 week  If you experience worsening of your admission symptoms, develop shortness of breath, life threatening emergency, suicidal or homicidal thoughts you must seek medical attention immediately by calling 911 or calling your MD immediately  if symptoms less severe.  You Must read complete instructions/literature along with all the possible adverse reactions/side effects for all the Medicines you take and that have been prescribed to you. Take any new Medicines after you have completely understood and accept all the possible adverse reactions/side effects.   Please note  You were cared for by a hospitalist during your hospital stay. If you have any questions about your discharge medications or the care you received while you were in the hospital after you are discharged, you can call the unit and asked to speak with the hospitalist on call if the hospitalist that took care of you is not available. Once you are discharged, your primary care physician will handle any further medical issues. Please note that NO REFILLS for any discharge medications will be authorized once you are discharged, as it is imperative that you return to your primary care physician (or establish a relationship with a primary care physician if you do not have one) for your aftercare needs so that they can reassess your need for medications and monitor your lab values.    Today   CHIEF COMPLAINT:   Chief Complaint  Patient presents with  . Cough  . Leg Swelling  . Weakness    VITAL SIGNS:  Blood pressure 98/63, pulse 75, temperature 97.9 F (36.6 C), temperature source Oral, resp. rate 16, height 5\' 8"  (1.727 m), weight 93.441 kg (206 lb), last menstrual period 10/21/2014, SpO2 96 %.  I/O:   Intake/Output Summary (Last 24 hours) at 07/19/15 1549 Last data filed at 07/19/15 0900  Gross per 24 hour  Intake    240 ml  Output      0 ml  Net    240 ml    PHYSICAL  EXAMINATION:   Physical Exam  GENERAL: 50 y.o.-year-old patient lying in the bed with no acute distress. Icteric looking sclerae. EYES: Pupils equal, round, reactive to light and accommodation. Significant scleral icterus. Extraocular muscles intact.  HEENT: Head atraumatic, normocephalic. Oropharynx and nasopharynx clear.  Scab under her left nostril- bleeding when patient picked at it. Pressure applied. NECK: Supple, no jugular venous distention. No thyroid enlargement, no tenderness.  LUNGS: Normal breath sounds bilaterally, no wheezing, rales,rhonchi or crepitation. No use of accessory muscles of respiration.  CARDIOVASCULAR: S1, S2 normal. No murmurs, rubs, or gallops.  ABDOMEN: Soft, tenderness on palpation in epigastric, nondistended. Bowel sounds present. No organomegaly or mass.  EXTREMITIES: No cyanosis, or clubbing. 1+ pedal edema NEUROLOGIC: Cranial nerves II through XII are intact. Muscle strength 5/5 in all extremities. Sensation intact. Gait not checked.  PSYCHIATRIC: The patient is alert and oriented x 3.  SKIN: No obvious rash, lesion, or ulcer.   DATA REVIEW:   CBC  Recent Labs Lab 07/16/15 0425  WBC 7.9  HGB 11.9*  HCT 34.6*  PLT 133*    Chemistries   Recent Labs Lab 07/19/15 0351  NA 133*  K 4.2  CL 98*  CO2 29  GLUCOSE 124*  BUN 10  CREATININE 0.53  CALCIUM 8.1*  AST 162*  ALT 61*  ALKPHOS 183*  BILITOT 9.2*    Cardiac Enzymes  Recent Labs Lab 07/15/15 1050  TROPONINI 0.03    Microbiology Results  No results found for this or any previous visit.  RADIOLOGY:  No results found.  EKG:  No orders found for this or any previous visit.    Management plans discussed with the patient, family and they are in agreement.  CODE STATUS:     Code Status Orders        Start     Ordered   07/15/15 1259  Full code   Continuous     07/15/15 1300    Code Status History    Date Active Date Inactive Code Status Order ID  Comments User Context   This patient has a current code status but no historical code status.      TOTAL TIME TAKING CARE OF THIS PATIENT: 38 minutes.    Enid Baas M.D on 07/19/2015 at 3:49 PM  Between 7am to 6pm - Pager - [phone]  After 6pm go to [email or web address] - password [email or web address] [facility]  [place] [facility]  Office  [phone]  CC: Primary care physician; No PCP Per Patient

## 2015-08-02 ENCOUNTER — Emergency Department: Payer: BLUE CROSS/BLUE SHIELD

## 2015-08-02 ENCOUNTER — Inpatient Hospital Stay
Admission: EM | Admit: 2015-08-02 | Discharge: 2015-08-04 | DRG: 441 | Disposition: A | Discharge status: Home / Self Care | Payer: BLUE CROSS/BLUE SHIELD | Attending: Internal Medicine | Admitting: Internal Medicine

## 2015-08-02 DIAGNOSIS — Z9884 Bariatric surgery status: Secondary | ICD-10-CM | POA: Diagnosis not present

## 2015-08-02 DIAGNOSIS — Z803 Family history of malignant neoplasm of breast: Secondary | ICD-10-CM

## 2015-08-02 DIAGNOSIS — I959 Hypotension, unspecified: Secondary | ICD-10-CM | POA: Diagnosis present

## 2015-08-02 DIAGNOSIS — J189 Pneumonia, unspecified organism: Secondary | ICD-10-CM | POA: Diagnosis present

## 2015-08-02 DIAGNOSIS — E876 Hypokalemia: Secondary | ICD-10-CM | POA: Diagnosis present

## 2015-08-02 DIAGNOSIS — E872 Acidosis: Secondary | ICD-10-CM | POA: Diagnosis present

## 2015-08-02 DIAGNOSIS — Z79899 Other long term (current) drug therapy: Secondary | ICD-10-CM

## 2015-08-02 DIAGNOSIS — R609 Edema, unspecified: Secondary | ICD-10-CM | POA: Diagnosis present

## 2015-08-02 DIAGNOSIS — R410 Disorientation, unspecified: Secondary | ICD-10-CM

## 2015-08-02 DIAGNOSIS — E162 Hypoglycemia, unspecified: Secondary | ICD-10-CM | POA: Diagnosis present

## 2015-08-02 DIAGNOSIS — F1010 Alcohol abuse, uncomplicated: Secondary | ICD-10-CM | POA: Diagnosis present

## 2015-08-02 DIAGNOSIS — K7290 Hepatic failure, unspecified without coma: Secondary | ICD-10-CM

## 2015-08-02 DIAGNOSIS — Z9049 Acquired absence of other specified parts of digestive tract: Secondary | ICD-10-CM

## 2015-08-02 DIAGNOSIS — K7030 Alcoholic cirrhosis of liver without ascites: Secondary | ICD-10-CM | POA: Diagnosis present

## 2015-08-02 DIAGNOSIS — R05 Cough: Secondary | ICD-10-CM

## 2015-08-02 DIAGNOSIS — A419 Sepsis, unspecified organism: Secondary | ICD-10-CM

## 2015-08-02 DIAGNOSIS — R059 Cough, unspecified: Secondary | ICD-10-CM

## 2015-08-02 DIAGNOSIS — Z9889 Other specified postprocedural states: Secondary | ICD-10-CM | POA: Diagnosis not present

## 2015-08-02 DIAGNOSIS — K7682 Hepatic encephalopathy: Secondary | ICD-10-CM

## 2015-08-02 HISTORY — DX: Alcoholic liver disease, unspecified: K70.9

## 2015-08-02 LAB — URINALYSIS COMPLETE WITH MICROSCOPIC (ARMC ONLY)
BACTERIA UA: NONE SEEN
BILIRUBIN URINE: NEGATIVE
Glucose, UA: 50 mg/dL — AB
Hgb urine dipstick: NEGATIVE
Ketones, ur: NEGATIVE mg/dL
NITRITE: NEGATIVE
PH: 6 (ref 5.0–8.0)
PROTEIN: NEGATIVE mg/dL
Specific Gravity, Urine: 1.009 (ref 1.005–1.030)

## 2015-08-02 LAB — COMPREHENSIVE METABOLIC PANEL
ALT: 80 U/L — ABNORMAL HIGH (ref 14–54)
ANION GAP: 11 (ref 5–15)
AST: 179 U/L — AB (ref 15–41)
Albumin: 2.4 g/dL — ABNORMAL LOW (ref 3.5–5.0)
Alkaline Phosphatase: 151 U/L — ABNORMAL HIGH (ref 38–126)
BUN: 14 mg/dL (ref 6–20)
CHLORIDE: 99 mmol/L — AB (ref 101–111)
CO2: 24 mmol/L (ref 22–32)
Calcium: 8.3 mg/dL — ABNORMAL LOW (ref 8.9–10.3)
Creatinine, Ser: 0.63 mg/dL (ref 0.44–1.00)
GFR calc Af Amer: >60 mL/min (ref >60)
Glucose, Bld: 89 mg/dL (ref 65–99)
POTASSIUM: 4.3 mmol/L (ref 3.5–5.1)
Sodium: 134 mmol/L — ABNORMAL LOW (ref 135–145)
TOTAL PROTEIN: 6.3 g/dL — AB (ref 6.5–8.1)
Total Bilirubin: 10.3 mg/dL — ABNORMAL HIGH (ref 0.3–1.2)

## 2015-08-02 LAB — ETHANOL: Alcohol, Ethyl (B): <5 mg/dL (ref <5)

## 2015-08-02 LAB — GLUCOSE, CAPILLARY
GLUCOSE-CAPILLARY: 55 mg/dL — AB (ref 65–99)
Glucose-Capillary: 56 mg/dL — ABNORMAL LOW (ref 65–99)
Glucose-Capillary: 126 mg/dL — ABNORMAL HIGH (ref 65–99)
Glucose-Capillary: 149 mg/dL — ABNORMAL HIGH (ref 65–99)

## 2015-08-02 LAB — CBC
HEMATOCRIT: 37.4 % (ref 35.0–47.0)
HEMOGLOBIN: 12.7 g/dL (ref 12.0–16.0)
MCH: 39.9 pg — ABNORMAL HIGH (ref 26.0–34.0)
MCHC: 33.8 g/dL (ref 32.0–36.0)
MCV: 117.9 fL — AB (ref 80.0–100.0)
Platelets: 165 10*3/uL (ref 150–440)
RBC: 3.17 MIL/uL — AB (ref 3.80–5.20)
RDW: 14.3 % (ref 11.5–14.5)
WBC: 17.5 10*3/uL — AB (ref 3.6–11.0)

## 2015-08-02 LAB — AMMONIA: Ammonia: 43 umol/L — ABNORMAL HIGH (ref 9–35)

## 2015-08-02 LAB — RAPID INFLUENZA A&B ANTIGENS
Influenza A (ARMC): NEGATIVE
Influenza B (ARMC): NEGATIVE

## 2015-08-02 LAB — PROTIME-INR
INR: 1.55
PROTHROMBIN TIME: 18.6 s — AB (ref 11.4–15.0)

## 2015-08-02 LAB — APTT: aPTT: 38 seconds — ABNORMAL HIGH (ref 24–36)

## 2015-08-02 LAB — TROPONIN I: Troponin I: 0.21 ng/mL — ABNORMAL HIGH (ref <0.031)

## 2015-08-02 LAB — LIPASE, BLOOD: Lipase: 17 U/L (ref 11–51)

## 2015-08-02 LAB — LACTIC ACID, PLASMA: Lactic Acid, Venous: 2.3 mmol/L (ref 0.5–2.0)

## 2015-08-02 LAB — ACETAMINOPHEN LEVEL

## 2015-08-02 LAB — SALICYLATE LEVEL

## 2015-08-02 MED ORDER — ONDANSETRON HCL 4 MG/2ML IJ SOLN: 4.0000 mg | Freq: Four times a day (QID) | INTRAMUSCULAR | Status: DC | PRN

## 2015-08-02 MED ORDER — LACTULOSE 10 GM/15ML PO SOLN
20.0000 g | Freq: Two times a day (BID) | ORAL | Status: DC
  Administered 2015-08-02 – 2015-08-04 (×4): 20 g via ORAL
  Filled 2015-08-02 (×4): qty 30

## 2015-08-02 MED ORDER — OXYCODONE HCL 5 MG PO TABS: 5.0000 mg | ORAL_TABLET | ORAL | Status: DC | PRN

## 2015-08-02 MED ORDER — SODIUM CHLORIDE 0.9 % IV BOLUS (SEPSIS)
1000.0000 mL | Freq: Once | INTRAVENOUS | Status: AC
  Administered 2015-08-02: 1000 mL via INTRAVENOUS

## 2015-08-02 MED ORDER — PIPERACILLIN-TAZOBACTAM 3.375 G IVPB
3.3750 g | Freq: Once | INTRAVENOUS | Status: AC
  Administered 2015-08-02: 3.375 g via INTRAVENOUS
  Filled 2015-08-02: qty 50

## 2015-08-02 MED ORDER — CETYLPYRIDINIUM CHLORIDE 0.05 % MT LIQD
7.0000 mL | Freq: Two times a day (BID) | OROMUCOSAL | Status: DC
  Administered 2015-08-02 – 2015-08-04 (×3): 7 mL via OROMUCOSAL

## 2015-08-02 MED ORDER — LEVOFLOXACIN IN D5W 750 MG/150ML IV SOLN
750.0000 mg | Freq: Once | INTRAVENOUS | Status: AC
  Administered 2015-08-02: 750 mg via INTRAVENOUS
  Filled 2015-08-02: qty 150

## 2015-08-02 MED ORDER — VITAMIN B-1 100 MG PO TABS
100.0000 mg | ORAL_TABLET | Freq: Every day | ORAL | Status: DC
  Administered 2015-08-02 – 2015-08-04 (×3): 100 mg via ORAL
  Filled 2015-08-02 (×3): qty 1

## 2015-08-02 MED ORDER — LEVOTHYROXINE SODIUM 50 MCG PO TABS
50.0000 ug | ORAL_TABLET | Freq: Every day | ORAL | Status: DC
  Administered 2015-08-03 – 2015-08-04 (×2): 50 ug via ORAL
  Filled 2015-08-02 (×2): qty 1

## 2015-08-02 MED ORDER — DEXTROSE 50 % IV SOLN
INTRAVENOUS | Status: AC
  Administered 2015-08-02: 50 mL via INTRAVENOUS
  Filled 2015-08-02: qty 50

## 2015-08-02 MED ORDER — FLUTICASONE PROPIONATE 50 MCG/ACT NA SUSP
1.0000 | Freq: Every day | NASAL | Status: DC | PRN
Start: 2015-08-02 — End: 2015-08-04
  Filled 2015-08-02: qty 16

## 2015-08-02 MED ORDER — DEXTROSE 50 % IV SOLN
50.0000 mL | Freq: Once | INTRAVENOUS | Status: AC
  Administered 2015-08-02: 50 mL via INTRAVENOUS

## 2015-08-02 MED ORDER — LACTULOSE 10 GM/15ML PO SOLN
20.0000 g | Freq: Once | ORAL | Status: AC
Start: 2015-08-02 — End: 2015-08-02
  Administered 2015-08-02: 20 g via ORAL
  Filled 2015-08-02: qty 30

## 2015-08-02 MED ORDER — SODIUM CHLORIDE 0.9 % IV BOLUS (SEPSIS)
500.0000 mL | Freq: Once | INTRAVENOUS | Status: AC
  Administered 2015-08-02: 500 mL via INTRAVENOUS

## 2015-08-02 MED ORDER — SODIUM CHLORIDE 0.9% FLUSH
3.0000 mL | Freq: Two times a day (BID) | INTRAVENOUS | Status: DC
  Administered 2015-08-02 – 2015-08-04 (×3): 3 mL via INTRAVENOUS

## 2015-08-02 MED ORDER — DEXTROSE-NACL 5-0.45 % IV SOLN
INTRAVENOUS | Status: DC
  Administered 2015-08-02: via INTRAVENOUS

## 2015-08-02 MED ORDER — ONDANSETRON HCL 4 MG PO TABS: 4.0000 mg | ORAL_TABLET | Freq: Four times a day (QID) | ORAL | Status: DC | PRN

## 2015-08-02 MED ORDER — PANTOPRAZOLE SODIUM 40 MG PO TBEC
40.0000 mg | DELAYED_RELEASE_TABLET | Freq: Two times a day (BID) | ORAL | Status: DC
  Administered 2015-08-02 – 2015-08-04 (×4): 40 mg via ORAL
  Filled 2015-08-02 (×4): qty 1

## 2015-08-02 MED ORDER — VANCOMYCIN HCL IN DEXTROSE 1-5 GM/200ML-% IV SOLN
1000.0000 mg | Freq: Once | INTRAVENOUS | Status: AC
Start: 2015-08-02 — End: 2015-08-02
  Administered 2015-08-02: 1000 mg via INTRAVENOUS
  Filled 2015-08-02: qty 200
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
## 2015-08-02 NOTE — Progress Notes (Signed)
Dr. Anne HahnWillis notified of elevated troponin of 0.21; Hx. Reviewed; Telemetry ST; denies chest pain; Windy Carinaurner,Lavante Toso K, RN9:57 PM 08/02/2015
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
## 2015-08-03 LAB — BASIC METABOLIC PANEL
ANION GAP: 6 (ref 5–15)
BUN: 11 mg/dL (ref 6–20)
CHLORIDE: 106 mmol/L (ref 101–111)
CO2: 25 mmol/L (ref 22–32)
Calcium: 7.7 mg/dL — ABNORMAL LOW (ref 8.9–10.3)
Creatinine, Ser: 0.68 mg/dL (ref 0.44–1.00)
GFR calc Af Amer: >60 mL/min (ref >60)
GLUCOSE: 117 mg/dL — AB (ref 65–99)
POTASSIUM: 3.5 mmol/L (ref 3.5–5.1)
Sodium: 137 mmol/L (ref 135–145)

## 2015-08-03 LAB — GLUCOSE, CAPILLARY
GLUCOSE-CAPILLARY: 173 mg/dL — AB (ref 65–99)
Glucose-Capillary: 137 mg/dL — ABNORMAL HIGH (ref 65–99)

## 2015-08-03 LAB — CBC
HEMATOCRIT: 32.1 % — AB (ref 35.0–47.0)
HEMOGLOBIN: 11 g/dL — AB (ref 12.0–16.0)
MCH: 40.6 pg — AB (ref 26.0–34.0)
MCHC: 34.2 g/dL (ref 32.0–36.0)
MCV: 118.6 fL — AB (ref 80.0–100.0)
PLATELETS: 124 10*3/uL — AB (ref 150–440)
RBC: 2.71 MIL/uL — AB (ref 3.80–5.20)
RDW: 14.3 % (ref 11.5–14.5)
WBC: 13.5 10*3/uL — AB (ref 3.6–11.0)

## 2015-08-03 LAB — TROPONIN I: Troponin I: 0.14 ng/mL — ABNORMAL HIGH (ref <0.031)

## 2015-08-03 LAB — CORTISOL: Cortisol, Plasma: 5.3 ug/dL

## 2015-08-03 LAB — PREGNANCY, URINE: PREG TEST UR: NEGATIVE

## 2015-08-03 LAB — AMMONIA: Ammonia: 42 umol/L — ABNORMAL HIGH (ref 9–35)

## 2015-08-03 LAB — LACTIC ACID, PLASMA: Lactic Acid, Venous: 2.1 mmol/L (ref 0.5–2.0)

## 2015-08-03 MED ORDER — LORAZEPAM 2 MG/ML IJ SOLN: 1.0000 mg | Freq: Four times a day (QID) | INTRAMUSCULAR | Status: DC | PRN

## 2015-08-03 MED ORDER — FOLIC ACID 1 MG PO TABS
1.0000 mg | ORAL_TABLET | Freq: Every day | ORAL | Status: DC
Start: 2015-08-03 — End: 2015-08-04
  Administered 2015-08-03 – 2015-08-04 (×2): 1 mg via ORAL
  Filled 2015-08-03 (×2): qty 1

## 2015-08-03 MED ORDER — LORAZEPAM 1 MG PO TABS
1.0000 mg | ORAL_TABLET | Freq: Four times a day (QID) | ORAL | Status: DC | PRN
  Administered 2015-08-04: 1 mg via ORAL
  Filled 2015-08-03: qty 1

## 2015-08-03 MED ORDER — LEVOFLOXACIN IN D5W 750 MG/150ML IV SOLN
750.0000 mg | INTRAVENOUS | Status: DC
Start: 2015-08-03 — End: 2015-08-04
  Administered 2015-08-03: 750 mg via INTRAVENOUS
  Filled 2015-08-03 (×2): qty 150

## 2015-08-03 MED ORDER — ADULT MULTIVITAMIN W/MINERALS CH
1.0000 | ORAL_TABLET | Freq: Every day | ORAL | Status: DC
  Administered 2015-08-03 – 2015-08-04 (×2): 1 via ORAL
  Filled 2015-08-03 (×2): qty 1

## 2015-08-03 MED ORDER — ASPIRIN 325 MG PO TABS
325.0000 mg | ORAL_TABLET | Freq: Every day | ORAL | Status: DC
  Administered 2015-08-03: 325 mg via ORAL
  Filled 2015-08-03: qty 1

## 2015-08-03 MED ORDER — ASPIRIN 81 MG PO CHEW: 324.0000 mg | CHEWABLE_TABLET | ORAL | Status: DC

## 2015-08-03 NOTE — Progress Notes (Addendum)
[facility] [place] at [facility]   PATIENT NAME: Rhonda Fuentes    MR#:  [id number]  DATE OF BIRTH:  [birth year]  SUBJECTIVE:  CHIEF COMPLAINT:   Chief Complaint  Patient presents with  . Altered Mental Status   Cough. Diarrhea 4 times. REVIEW OF SYSTEMS:  CONSTITUTIONAL: No fever, fatigue or weakness.  EYES: No blurred or double vision.  EARS, NOSE, AND THROAT: No tinnitus or ear pain.  RESPIRATORY: No cough, shortness of breath, wheezing or hemoptysis.  CARDIOVASCULAR: No chest pain, orthopnea, has leg edema.  GASTROINTESTINAL: No nausea, vomiting, or  abdominal pain.  has Diarrhea. GENITOURINARY: No dysuria, hematuria.  ENDOCRINE: No polyuria, nocturia,  HEMATOLOGY: No anemia, easy bruising or bleeding SKIN: No rash or lesion. MUSCULOSKELETAL: No joint pain or arthritis.   NEUROLOGIC: No tingling, numbness, weakness.  PSYCHIATRY: No anxiety or depression.   DRUG ALLERGIES:  No Known Allergies  VITALS:  Blood pressure 101/59, pulse 115, temperature 97.8 F (36.6 C), temperature source Oral, resp. rate 17, height 5\' 8"  (1.727 m), weight 95.573 kg (210 lb 11.2 oz), last menstrual period 10/21/2014, SpO2 98 %.  PHYSICAL EXAMINATION:  GENERAL:  50 y.o.-year-old patient lying in the bed with no acute distress.  EYES: Pupils equal, round, reactive to light and accommodation. has scleral icterus. Extraocular muscles intact.  HEENT: Head atraumatic, normocephalic. Oropharynx and nasopharynx clear.  NECK:  Supple, no jugular venous distention. No thyroid enlargement, no tenderness.  LUNGS: Normal breath sounds bilaterally, no wheezing, rales,rhonchi or crepitation. No use of accessory muscles of respiration.  CARDIOVASCULAR: S1, S2 normal. No murmurs, rubs, or gallops.  ABDOMEN: Soft, nontender, nondistended. Bowel sounds present. No organomegaly or mass.  EXTREMITIES: bilateral leg edema 1-2+, no cyanosis, or clubbing.  NEUROLOGIC: Cranial nerves II  through XII are intact. Muscle strength 4-5/5 in all extremities. Sensation intact. Gait not checked.  PSYCHIATRIC: The patient is alert and oriented x 3.  SKIN: No obvious rash, lesion, or ulcer.  but has jaundice.    LABORATORY PANEL:   CBC  Recent Labs Lab 08/03/15 0611  WBC 13.5*  HGB 11.0*  HCT 32.1*  PLT 124*   ------------------------------------------------------------------------------------------------------------------  Chemistries   Recent Labs Lab 08/02/15 1703 08/03/15 0611  NA 134* 137  K 4.3 3.5  CL 99* 106  CO2 24 25  GLUCOSE 89 117*  BUN 14 11  CREATININE 0.63 0.68  CALCIUM 8.3* 7.7*  AST 179*  --   ALT 80*  --   ALKPHOS 151*  --   BILITOT 10.3*  --    ------------------------------------------------------------------------------------------------------------------  Cardiac Enzymes  Recent Labs Lab 08/02/15 2227  TROPONINI <0.03   ------------------------------------------------------------------------------------------------------------------  RADIOLOGY:  Dg Chest Portable 1 View  08/02/2015  CLINICAL DATA:  Cough for 1 week.  Jaundice.  Hypotension. EXAM: PORTABLE CHEST 1 VIEW COMPARISON:  Abdominal pelvic CT 07/16/2015. No previous chest radiographs. FINDINGS: 1830 hours. There is persistent right hemidiaphragm elevation. The patchy atelectasis seen at both lung bases on the recent abdominal CT appears improved. However, there is more confluent right upper lobe opacity suspicious for pneumonia. There is no pleural effusion or pneumothorax. The heart size and mediastinal contours are normal. No acute osseous findings are seen. IMPRESSION: Right upper lobe airspace disease suspicious for pneumonia. Patchy bibasilar atelectasis appears improved from abdominal CT of 07/16/2015. Followup PA and lateral chest X-ray is recommended in 3-4 weeks following trial of antibiotic therapy to ensure resolution and exclude underlying malignancy. Electronically  Signed   By:  Carey Bullocks M.D.   On: 08/02/2015 18:53    EKG:   Orders placed or performed during the hospital encounter of 08/02/15  . ED EKG  . ED EKG  . EKG 12-Lead  . EKG 12-Lead    ASSESSMENT AND PLAN:   Patient is a 50 year old white female with liver cirrhosis who is brought into the hospital with confusion  1. Acute encephalopathy: likely due to hypoglycemia mild hepatic encephalopathy She was treated with with dextrose-containing fluids.  hold gabapentin, oxycodone  mental status improved. Discontinue IV fluids.  2. Pneumonia, right upper lobe: continue  IV Levaquin.follow-up blood culture. Get sputum culture if possible.   leukocytosis. Improving.   3. Liver cirrohsis with fluid retention. Hold Lasix and spinal lactone due to low blood pressure.  continue lactulose.   4. Hypoglycemia could be related to her liver cirrhosis. Improved.    lactic acidosis. Improved.   history of alcohol abuse. CIWA protocol.   All the records are reviewed and case discussed with Care Management/Social Workerr. Management plans discussed with the patient, family and they are in agreement.  CODE STATUS: Full code  TOTAL TIME TAKING CARE OF THIS PATIENT: 38 minutes.  Greater than 50% time was spent on coordination of care and face-to-face counseling.  POSSIBLE D/C IN 3 DAYS, DEPENDING ON CLINICAL CONDITION.   Shaune Pollack M.D on 08/03/2015 at 2:45 PM  Between 7am to 6pm - Pager - [phone]  After 6pm go to [email or web address] - password [email or web address] [facility]  [place] [facility]  Office  [phone]  CC: Primary care physician; No PCP Per Patient

## 2015-08-03 NOTE — Progress Notes (Signed)
Pt alert, received notification from PT that pt had complained of chest pain. reassessed pt and pt was lying in bed, able to carry conversation, asked pt about chest pain, and pt reported that it occurred off and on with a "gripping" feeling.  Notified Dr. Imogene Burnhen of pt status and new orders received. Asprin 325mg  given to pt. Pt resting in bed.  Continue to assess.

## 2015-08-03 NOTE — Progress Notes (Signed)
PT Hold Note  Patient Details Name: Rhonda Fuentes MRN: [id number] DOB: [birth year]   Cancelled Treatment:    Reason Eval/Treat Not Completed: Medical issues which prohibited therapy. Chart reviewed and RN consulted. Attempted PT evaluation. Upon entering room pt complaining of L sided "burning" chest pain which just started. Reports that pain is worse with reaching, coughing, and deep inspiration. Also complaining of increased anxiety. RN notified who ordered CNA to get vitals. Will hold PT evaluation currently due to new onset chest pain. HR also elevated at rest around 110-115. Will perform PT evaluation as soon as pt is medically appropriate.   Sharalyn InkJason D Kemoni Ortega PT, DPT   Rhonda Fuentes 08/03/2015, 4:19 PM

## 2015-08-03 NOTE — Progress Notes (Signed)
Patient states that she will not sign the blood consent until she needs it. Windy Carinaurner,Azarah Dacy K, RN 6:40 AM 08/03/2015

## 2015-08-03 NOTE — Progress Notes (Signed)
ST received orders, reviewed chart and consulted nursing. ST provided screen with regular diet textures and thin liquids. Pt without s/s of aspiration/dysphagia. Pt doesn't report any s/s of aspiration or dysphagia with consumption of current diet. Aspiration precautions reviewed and pt voiced understanding. Nursing doesn't report any s/s of aspiration with intake. Skilled ST doesn't appear indicated at this time. Education provided to pt who was in agreement. ST can rescreen if pt condition changes.

## 2015-08-04 LAB — BASIC METABOLIC PANEL
ANION GAP: 8 (ref 5–15)
BUN: 8 mg/dL (ref 6–20)
CALCIUM: 7.6 mg/dL — AB (ref 8.9–10.3)
CHLORIDE: 105 mmol/L (ref 101–111)
CO2: 22 mmol/L (ref 22–32)
Creatinine, Ser: 0.62 mg/dL (ref 0.44–1.00)
GFR calc non Af Amer: >60 mL/min (ref >60)
GLUCOSE: 79 mg/dL (ref 65–99)
Potassium: 3.4 mmol/L — ABNORMAL LOW (ref 3.5–5.1)
Sodium: 135 mmol/L (ref 135–145)

## 2015-08-04 LAB — CBC
HEMATOCRIT: 34.3 % — AB (ref 35.0–47.0)
HEMOGLOBIN: 11.5 g/dL — AB (ref 12.0–16.0)
MCH: 40 pg — ABNORMAL HIGH (ref 26.0–34.0)
MCHC: 33.6 g/dL (ref 32.0–36.0)
MCV: 119 fL — AB (ref 80.0–100.0)
Platelets: 110 10*3/uL — ABNORMAL LOW (ref 150–440)
RBC: 2.88 MIL/uL — ABNORMAL LOW (ref 3.80–5.20)
RDW: 14.5 % (ref 11.5–14.5)
WBC: 12.7 10*3/uL — ABNORMAL HIGH (ref 3.6–11.0)

## 2015-08-04 LAB — TROPONIN I: TROPONIN I: 0.04 ng/mL — AB (ref <0.031)

## 2015-08-04 LAB — MAGNESIUM: Magnesium: 1.5 mg/dL — ABNORMAL LOW (ref 1.7–2.4)

## 2015-08-04 MED ORDER — LEVOFLOXACIN 750 MG PO TABS: 750.0000 mg | ORAL_TABLET | Freq: Every day | ORAL | Status: DC

## 2015-08-04 MED ORDER — MAGNESIUM SULFATE 2 GM/50ML IV SOLN
2.0000 g | Freq: Once | INTRAVENOUS | Status: AC
  Administered 2015-08-04: 2 g via INTRAVENOUS
  Filled 2015-08-04: qty 50

## 2015-08-04 MED ORDER — ASPIRIN 81 MG PO CHEW
81.0000 mg | CHEWABLE_TABLET | Freq: Every day | ORAL | Status: DC
  Administered 2015-08-04: 81 mg via ORAL
  Filled 2015-08-04: qty 1

## 2015-08-04 MED ORDER — POTASSIUM CHLORIDE CRYS ER 20 MEQ PO TBCR
40.0000 meq | EXTENDED_RELEASE_TABLET | Freq: Once | ORAL | Status: AC
  Administered 2015-08-04: 40 meq via ORAL
  Filled 2015-08-04: qty 2

## 2015-08-04 NOTE — Discharge Summary (Signed)
[facility] - [place] at [facility]   PATIENT NAME: Rhonda Fuentes    MR#:  [id number]  DATE OF BIRTH:  1965-10-23  DATE OF ADMISSION:  08/02/2015 ADMITTING PHYSICIAN: Auburn Bilberry, MD  DATE OF DISCHARGE: 08/04/2015 12:40 PM  PRIMARY CARE PHYSICIAN: No PCP Per Patient    ADMISSION DIAGNOSIS:  Hepatic encephalopathy (HCC) [K72.90] Cough [R05] Confusion [R41.0] Sepsis due to pneumonia (HCC) [J18.9, A41.9]   DISCHARGE DIAGNOSIS:  Acute encephalopathy: likely due to hypoglycemia  Pneumonia, right upper lobe (CAP) SECONDARY DIAGNOSIS:   Past Medical History  Diagnosis Date  . Anemia   . Thyroid disease   . Alcohol abuse   . 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 Acute encephalopathy: likely due to hypoglycemia mild hepatic encephalopathy She was treated with with dextrose-containing fluids. hold gabapentin, oxycodone mental status improved. Discontinue IV fluids.  2. Pneumonia, right upper lobe: continue IV Levaquin.follow-up blood culture. Get sputum culture if possible.  leukocytosis. Improving.   3. Liver cirrohsis with fluid retention. Hold Lasix and spinal lactone due to low blood pressure.Follow-up PCP or GI physician as outpatient to resume. continue lactulose.   4. Hypoglycemia could be related to her liver cirrhosis. Improved.   lactic acidosis. Improved.  history of alcohol abuse. CIWA protocol. No signs of withdrawal.  Hypomagnesemia and hypokalemia. Given supplement and follow-up level as outpatient.  DISCHARGE CONDITIONS:   Stable, discharged to home today.  CONSULTS OBTAINED:     DRUG ALLERGIES:  No Known Allergies  DISCHARGE MEDICATIONS:   Discharge Medication List as of 08/04/2015 10:54 AM    START taking these medications   Details  levofloxacin (LEVAQUIN) 750 MG tablet  Take 1 tablet (750 mg total) by mouth daily., Starting 08/04/2015, Until Discontinued, Print      CONTINUE these medications which have NOT CHANGED   Details  cyclobenzaprine (FLEXERIL) 10 MG tablet Take 5 mg by mouth 2 (two) times daily as needed for muscle spasms., Until Discontinued, Historical Med    !! Eszopiclone (ESZOPICLONE) 3 MG TABS Take 3 mg by mouth at bedtime as needed (for sleep). Take immediately before bedtime, Until Discontinued, Historical Med    !! eszopiclone (LUNESTA) 2 MG TABS tablet Take 2 mg by mouth at bedtime as needed for sleep. Take immediately before bedtime, Until Discontinued, Historical Med    fluticasone (FLONASE) 50 MCG/ACT nasal spray Place 1-2 sprays into both nostrils daily as needed for rhinitis., Until Discontinued, Historical Med    folic acid (FOLVITE) 1 MG tablet Take 1 tablet (1 mg total) by mouth daily., Starting 07/19/2015, Until Discontinued, Normal    gabapentin (NEURONTIN) 400 MG capsule Take 1 capsule (400 mg total) by mouth at bedtime., Starting 07/19/2015, Until Discontinued, Normal    lactulose (CHRONULAC) 10 GM/15ML solution Take 30 mLs (20 g total) by mouth 2 (two) times daily., Starting 07/19/2015, Until Discontinued, Normal    levothyroxine (SYNTHROID, LEVOTHROID) 50 MCG tablet Take 50 mcg by mouth daily before breakfast. , Until Discontinued, Historical Med    oxyCODONE (OXY IR/ROXICODONE) 5 MG immediate release tablet Take 5 mg by mouth every 4 (four) hours as needed for severe pain., Until Discontinued, Historical Med    pantoprazole (PROTONIX) 40 MG tablet Take 40 mg by mouth 2 (two) times daily., Until Discontinued, Historical Med    thiamine (VITAMIN B-1) 100 MG  tablet Take 100 mg by mouth daily., Until Discontinued, Historical Med     !! - Potential duplicate medications found. Please discuss with provider.    STOP taking these medications     furosemide (LASIX) 20 MG tablet      spironolactone (ALDACTONE) 25 MG tablet       predniSONE (STERAPRED UNI-PAK 21 TAB) 10 MG (21) TBPK tablet          DISCHARGE INSTRUCTIONS:    If you experience worsening of your admission symptoms, develop shortness of breath, life threatening emergency, suicidal or homicidal thoughts you must seek medical attention immediately by calling 911 or calling your MD immediately  if symptoms less severe.  You Must read complete instructions/literature along with all the possible adverse reactions/side effects for all the Medicines you take and that have been prescribed to you. Take any new Medicines after you have completely understood and accept all the possible adverse reactions/side effects.   Please note  You were cared for by a hospitalist during your hospital stay. If you have any questions about your discharge medications or the care you received while you were in the hospital after you are discharged, you can call the unit and asked to speak with the hospitalist on call if the hospitalist that took care of you is not available. Once you are discharged, your primary care physician will handle any further medical issues. Please note that NO REFILLS for any discharge medications will be authorized once you are discharged, as it is imperative that you return to your primary care physician (or establish a relationship with a primary care physician if you do not have one) for your aftercare needs so that they can reassess your need for medications and monitor your lab values.    Today   SUBJECTIVE   No complaint.   VITAL SIGNS:  Blood pressure 95/54, pulse 96, temperature 99.1 F (37.3 C), temperature source Oral, resp. rate 18, height 5\' 8"  (1.727 m), weight 95.573 kg (210 lb 11.2 oz), last menstrual period 10/21/2014, SpO2 97 %.  I/O:   Intake/Output Summary (Last 24 hours) at 08/04/15 1449 Last data filed at 08/04/15 0800  Gross per 24 hour  Intake    470 ml  Output    450 ml  Net     20 ml    PHYSICAL EXAMINATION:   GENERAL:  50 y.o.-year-old patient lying in the bed with no acute distress.  EYES: Pupils equal, round, reactive to light and accommodation. Has mild scleral icterus. Extraocular muscles intact.  HEENT: Head atraumatic, normocephalic. Oropharynx and nasopharynx clear.  NECK:  Supple, no jugular venous distention. No thyroid enlargement, no tenderness.  LUNGS: Normal breath sounds bilaterally, no wheezing, rales,rhonchi or crepitation. No use of accessory muscles of respiration.  CARDIOVASCULAR: S1, S2 normal. No murmurs, rubs, or gallops.  ABDOMEN: Soft, non-tender, non-distended. Bowel sounds present. No organomegaly or mass.  EXTREMITIES: Has bilateral leg edema, no cyanosis, or clubbing.  NEUROLOGIC: Cranial nerves II through XII are intact. Muscle strength 5/5 in all extremities. Sensation intact. Gait not checked.  PSYCHIATRIC: The patient is alert and oriented x 3.  SKIN: No obvious rash, lesion, or ulcer. But has jaundice.  DATA REVIEW:   CBC  Recent Labs Lab 08/04/15 0403  WBC 12.7*  HGB 11.5*  HCT 34.3*  PLT 110*    Chemistries   Recent Labs Lab 08/02/15 1703  08/04/15 0403  NA 134*  < > 135  K 4.3  < >  3.4*  CL 99*  < > 105  CO2 24  < > 22  GLUCOSE 89  < > 79  BUN 14  < > 8  CREATININE 0.63  < > 0.62  CALCIUM 8.3*  < > 7.6*  MG  --   --  1.5*  AST 179*  --   --   ALT 80*  --   --   ALKPHOS 151*  --   --   BILITOT 10.3*  --   --   < > = values in this interval not displayed.  Cardiac Enzymes  Recent Labs Lab 08/04/15 0754  TROPONINI 0.04*    Microbiology Results  Results for orders placed or performed during the hospital encounter of 08/02/15  Blood culture (routine x 2)     Status: None (Preliminary result)   Collection Time: 08/02/15  6:45 PM  Result Value Ref Range Status   Specimen Description BLOOD LEFT ASSIST CONTROL  Final   Special Requests BOTTLES DRAWN AEROBIC AND ANAEROBIC 5CC  Final   Culture NO GROWTH 2 DAYS  Final   Report Status  PENDING  Incomplete  Blood culture (routine x 2)     Status: None (Preliminary result)   Collection Time: 08/02/15  6:55 PM  Result Value Ref Range Status   Specimen Description BLOOD LEFT ASSIST CONTROL  Final   Special Requests BOTTLES DRAWN AEROBIC AND ANAEROBIC  5CC  Final   Culture NO GROWTH 2 DAYS  Final   Report Status PENDING  Incomplete  Rapid Influenza A&B Antigens ([facility] only)     Status: None   Collection Time: 08/02/15  7:32 PM  Result Value Ref Range Status   Influenza A ([facility]) NEGATIVE NEGATIVE Final   Influenza B ([facility]) NEGATIVE NEGATIVE Final    RADIOLOGY:  Dg Chest Portable 1 View  08/02/2015  CLINICAL DATA:  Cough for 1 week.  Jaundice.  Hypotension. EXAM: PORTABLE CHEST 1 VIEW COMPARISON:  Abdominal pelvic CT 07/16/2015. No previous chest radiographs. FINDINGS: 1830 hours. There is persistent right hemidiaphragm elevation. The patchy atelectasis seen at both lung bases on the recent abdominal CT appears improved. However, there is more confluent right upper lobe opacity suspicious for pneumonia. There is no pleural effusion or pneumothorax. The heart size and mediastinal contours are normal. No acute osseous findings are seen. IMPRESSION: Right upper lobe airspace disease suspicious for pneumonia. Patchy bibasilar atelectasis appears improved from abdominal CT of 07/16/2015. Followup PA and lateral chest X-ray is recommended in 3-4 weeks following trial of antibiotic therapy to ensure resolution and exclude underlying malignancy. Electronically Signed   By: Carey Bullocks M.D.   On: 08/02/2015 18:53        Management plans discussed with the patient, family and they are in agreement.  CODE STATUS:     Code Status Orders        Start     Ordered   08/02/15 2021  Full code   Continuous     08/02/15 2021    Code Status History    Date Active Date Inactive Code Status Order ID Comments User Context   07/15/2015  1:00 PM 07/19/2015  9:12 PM Full Code [id number]   Milagros Loll, MD ED      TOTAL TIME TAKING CARE OF THIS PATIENT: 35 minutes.    Shaune Pollack M.D on 08/04/2015 at 2:49 PM  Between 7am to 6pm - Pager - [phone]  After 6pm go to [email or web address] - password [email or web address] [facility]  [facility]  Office  [phone]  CC: Primary care physician; No PCP Per Patient

## 2015-08-04 NOTE — Progress Notes (Signed)
Patient A/O, no noted distress. CIWA protocol see EMAR. Patient slept well. Wrapped IV site with gauze as per patient request. Denies any chest pain. Patient ambulates to BR with stand by assist.  Staff will continue to monitor and meet needs,.

## 2015-08-04 NOTE — Evaluation (Signed)
Physical Therapy Evaluation Patient Details Name: Rhonda Fuentes MRN: [id number] DOB: 06-21-1965 Today's Date: 08/04/2015   History of Present Illness  Rhonda Fuentes is a 50 y.o. female with a known history of liver cirrhosis, hypothyrodis was recently hospitalized and diagnosed with new liver cirrhosis related to alcohol and Tylenol use. Who is brought in by her husband because patient has not been acting right. Earlier today there was a lot of smoke in the house. She stated that she was cooking but in reality she was not cooking. Patient also has been having cough and congestion for the past few weeks. Patient came to the emergency room with the symptoms. She is noted to be hypoglycemic chest x-ray suggestive of right sided pneumonia. She has had chills but no fevers. Denies any abdominal pain nausea vomiting or diarrhea. No falls in the last 12 months  Clinical Impression  Pt demonstrates excellent stability and safety with all mobility. She is able to ambulate a full lap around RN station without assistive device as well as ascend/descend 6 stairs safely with railing. Modified DGI: 12/12. HR does increases from 105 at rest to 120bpm with activity. She denies chest pain or DOE and SaO2 remains at or above 98% throughout entire session. No further PT needs identified at this time. Will complete order. Please re-consult if status changes or needs arise.     Follow Up Recommendations No PT follow up    Equipment Recommendations  None recommended by PT    Recommendations for Other Services       Precautions / Restrictions Precautions Precautions: Fall Restrictions Weight Bearing Restrictions: No      Mobility  Bed Mobility Overal bed mobility: Independent             General bed mobility comments: Good speed/sequencing  Transfers Overall transfer level: Independent Equipment used: None             General transfer comment: Safe and steady with transfers without UE  use  Ambulation/Gait Ambulation/Gait assistance: Supervision Ambulation Distance (Feet): 250 Feet Assistive device: None Gait Pattern/deviations: WFL(Within Functional Limits)   Gait velocity interpretation: >2.62 ft/sec, indicative of independent community ambulator General Gait Details: Pt demonstrates adequate gait speed for full community mobility. Good step length and safety awareness. Modified DGI: 12/12. Denies chest pain throughout ambulation. HR increases from 105 at rest to 120 with ambulation  Stairs Stairs: Yes Stairs assistance: Min guard Stair Management: Two rails Number of Stairs: 6 General stair comments: Safe ascend/descend 6 stairs with bilateral rails. No safety concerns. Pt denies DOE or chest pain  Wheelchair Mobility    Modified Rankin (Stroke Patients Only)       Balance Overall balance assessment: No apparent balance deficits (not formally assessed) (Modified DGI: 12/12)                                           Pertinent Vitals/Pain Pain Assessment: 0-10 Pain Score: 2  Pain Location: Low back pain, "from laying in bed." Denies chest pain Pain Intervention(s): Monitored during session    Home Living Family/patient expects to be discharged to:: Private residence Living Arrangements: Spouse/significant other Available Help at Discharge: Family Type of Home: Apartment Home Access: Stairs to enter Entrance Stairs-Rails: Can reach both Entrance Stairs-Number of Steps: 2 flights Home Layout: One level Home Equipment: Shower seat - built in (No assistive devices)  Prior Function Level of Independence: Independent         Comments: Drives and independent with ADLs/IADLs     Hand Dominance   Dominant Hand: Right    Extremity/Trunk Assessment   Upper Extremity Assessment: Overall WFL for tasks assessed           Lower Extremity Assessment: Overall WFL for tasks assessed         Communication    Communication: No difficulties  Cognition Arousal/Alertness: Awake/alert Behavior During Therapy: WFL for tasks assessed/performed Overall Cognitive Status: Within Functional Limits for tasks assessed                      General Comments      Exercises        Assessment/Plan    PT Assessment Patent does not need any further PT services  PT Diagnosis     PT Problem List    PT Treatment Interventions     PT Goals (Current goals can be found in the Care Plan section)      Frequency     Barriers to discharge        Co-evaluation               End of Session Equipment Utilized During Treatment: Gait belt Activity Tolerance: Patient tolerated treatment well Patient left: in bed;with call bell/phone within reach;with bed alarm set Nurse Communication: Mobility status         Time: 1010-1022 PT Time Calculation (min) (ACUTE ONLY): 12 min   Charges:   PT Evaluation $PT Eval Low Complexity: 1 Procedure     PT G Codes:       Sharalyn InkJason D Joby Richart PT, DPT   Amra Shukla 08/04/2015, 10:39 AM

## 2015-08-04 NOTE — Progress Notes (Signed)
Patient discharged home.  All discharge instructions reviewed and discharge paperwork given to patient.  Patient verbalized understanding.  IVs removed in tact. Prescription given to patient with all questions and concerns addressed. Patient's friend at bedside for transfer home.

## 2015-08-04 NOTE — Discharge Instructions (Signed)
Heart healthy diet. Activity as tolerated. Lasix and spironolactone are hold due to low BP. Follow up PCP or Dr. Markham JordanElliot for resuming. Alcohol detox as outpatient.

## 2015-08-07 LAB — CULTURE, BLOOD (ROUTINE X 2)
CULTURE: NO GROWTH
Culture: NO GROWTH

## 2015-08-12 ENCOUNTER — Inpatient Hospital Stay (HOSPITAL_COMMUNITY)
Admission: EM | Admit: 2015-08-12 | Discharge: 2015-08-27 | DRG: 871 | Disposition: A | Discharge status: Home Health | Payer: BLUE CROSS/BLUE SHIELD | Attending: Internal Medicine | Admitting: Internal Medicine

## 2015-08-12 ENCOUNTER — Inpatient Hospital Stay (HOSPITAL_COMMUNITY): Payer: BLUE CROSS/BLUE SHIELD

## 2015-08-12 ENCOUNTER — Emergency Department (HOSPITAL_COMMUNITY): Payer: BLUE CROSS/BLUE SHIELD

## 2015-08-12 ENCOUNTER — Encounter (HOSPITAL_COMMUNITY): Payer: Self-pay | Admitting: Emergency Medicine

## 2015-08-12 DIAGNOSIS — D539 Nutritional anemia, unspecified: Secondary | ICD-10-CM | POA: Diagnosis present

## 2015-08-12 DIAGNOSIS — F1290 Cannabis use, unspecified, uncomplicated: Secondary | ICD-10-CM | POA: Diagnosis present

## 2015-08-12 DIAGNOSIS — R4182 Altered mental status, unspecified: Secondary | ICD-10-CM

## 2015-08-12 DIAGNOSIS — A419 Sepsis, unspecified organism: Secondary | ICD-10-CM | POA: Diagnosis present

## 2015-08-12 DIAGNOSIS — E872 Acidosis, unspecified: Secondary | ICD-10-CM | POA: Diagnosis present

## 2015-08-12 DIAGNOSIS — J189 Pneumonia, unspecified organism: Secondary | ICD-10-CM | POA: Diagnosis present

## 2015-08-12 DIAGNOSIS — K7031 Alcoholic cirrhosis of liver with ascites: Secondary | ICD-10-CM | POA: Diagnosis present

## 2015-08-12 DIAGNOSIS — Y95 Nosocomial condition: Secondary | ICD-10-CM | POA: Diagnosis present

## 2015-08-12 DIAGNOSIS — R609 Edema, unspecified: Secondary | ICD-10-CM | POA: Diagnosis not present

## 2015-08-12 DIAGNOSIS — I472 Ventricular tachycardia: Secondary | ICD-10-CM | POA: Diagnosis present

## 2015-08-12 DIAGNOSIS — E876 Hypokalemia: Secondary | ICD-10-CM | POA: Diagnosis present

## 2015-08-12 DIAGNOSIS — E8809 Other disorders of plasma-protein metabolism, not elsewhere classified: Secondary | ICD-10-CM | POA: Diagnosis present

## 2015-08-12 DIAGNOSIS — R600 Localized edema: Secondary | ICD-10-CM | POA: Diagnosis present

## 2015-08-12 DIAGNOSIS — R188 Other ascites: Secondary | ICD-10-CM

## 2015-08-12 DIAGNOSIS — Z22322 Carrier or suspected carrier of Methicillin resistant Staphylococcus aureus: Secondary | ICD-10-CM | POA: Diagnosis not present

## 2015-08-12 DIAGNOSIS — G894 Chronic pain syndrome: Secondary | ICD-10-CM | POA: Diagnosis present

## 2015-08-12 DIAGNOSIS — Z79899 Other long term (current) drug therapy: Secondary | ICD-10-CM

## 2015-08-12 DIAGNOSIS — Z09 Encounter for follow-up examination after completed treatment for conditions other than malignant neoplasm: Secondary | ICD-10-CM

## 2015-08-12 DIAGNOSIS — E039 Hypothyroidism, unspecified: Secondary | ICD-10-CM | POA: Diagnosis present

## 2015-08-12 DIAGNOSIS — R410 Disorientation, unspecified: Secondary | ICD-10-CM | POA: Diagnosis present

## 2015-08-12 DIAGNOSIS — K7290 Hepatic failure, unspecified without coma: Secondary | ICD-10-CM | POA: Diagnosis present

## 2015-08-12 DIAGNOSIS — D689 Coagulation defect, unspecified: Secondary | ICD-10-CM | POA: Diagnosis present

## 2015-08-12 DIAGNOSIS — I493 Ventricular premature depolarization: Secondary | ICD-10-CM | POA: Diagnosis not present

## 2015-08-12 DIAGNOSIS — F10231 Alcohol dependence with withdrawal delirium: Secondary | ICD-10-CM | POA: Diagnosis present

## 2015-08-12 DIAGNOSIS — K766 Portal hypertension: Secondary | ICD-10-CM | POA: Diagnosis present

## 2015-08-12 DIAGNOSIS — Z9884 Bariatric surgery status: Secondary | ICD-10-CM | POA: Diagnosis not present

## 2015-08-12 DIAGNOSIS — K7011 Alcoholic hepatitis with ascites: Secondary | ICD-10-CM | POA: Diagnosis present

## 2015-08-12 DIAGNOSIS — K219 Gastro-esophageal reflux disease without esophagitis: Secondary | ICD-10-CM | POA: Diagnosis present

## 2015-08-12 DIAGNOSIS — K7682 Hepatic encephalopathy: Secondary | ICD-10-CM | POA: Diagnosis present

## 2015-08-12 DIAGNOSIS — N179 Acute kidney failure, unspecified: Secondary | ICD-10-CM | POA: Diagnosis present

## 2015-08-12 DIAGNOSIS — K7030 Alcoholic cirrhosis of liver without ascites: Secondary | ICD-10-CM | POA: Diagnosis present

## 2015-08-12 DIAGNOSIS — I509 Heart failure, unspecified: Secondary | ICD-10-CM | POA: Diagnosis not present

## 2015-08-12 LAB — CBC WITH DIFFERENTIAL/PLATELET
BASOS PCT: 0 %
Basophils Absolute: 0 10*3/uL (ref 0.0–0.1)
Eosinophils Absolute: 0.1 10*3/uL (ref 0.0–0.7)
Eosinophils Relative: 0 %
HEMATOCRIT: 31.3 % — AB (ref 36.0–46.0)
HEMOGLOBIN: 11 g/dL — AB (ref 12.0–15.0)
LYMPHS ABS: 1.1 10*3/uL (ref 0.7–4.0)
Lymphocytes Relative: 9 %
MCH: 38.2 pg — ABNORMAL HIGH (ref 26.0–34.0)
MCHC: 35.1 g/dL (ref 30.0–36.0)
MCV: 108.7 fL — ABNORMAL HIGH (ref 78.0–100.0)
MONO ABS: 0.7 10*3/uL (ref 0.1–1.0)
MONOS PCT: 5 %
NEUTROS ABS: 10.6 10*3/uL — AB (ref 1.7–7.7)
NEUTROS PCT: 86 %
Platelets: 236 10*3/uL (ref 150–400)
RBC: 2.88 MIL/uL — ABNORMAL LOW (ref 3.87–5.11)
RDW: 14.1 % (ref 11.5–15.5)
WBC: 12.4 10*3/uL — ABNORMAL HIGH (ref 4.0–10.5)

## 2015-08-12 LAB — COMPREHENSIVE METABOLIC PANEL
ALK PHOS: 131 U/L — AB (ref 38–126)
ALT: 38 U/L (ref 14–54)
ANION GAP: 12 (ref 5–15)
AST: 144 U/L — ABNORMAL HIGH (ref 15–41)
Albumin: 2.3 g/dL — ABNORMAL LOW (ref 3.5–5.0)
BUN: 9 mg/dL (ref 6–20)
CALCIUM: 8 mg/dL — AB (ref 8.9–10.3)
CHLORIDE: 103 mmol/L (ref 101–111)
CO2: 22 mmol/L (ref 22–32)
Creatinine, Ser: 1.01 mg/dL — ABNORMAL HIGH (ref 0.44–1.00)
GFR calc non Af Amer: >60 mL/min (ref >60)
Glucose, Bld: 103 mg/dL — ABNORMAL HIGH (ref 65–99)
Potassium: 3.5 mmol/L (ref 3.5–5.1)
SODIUM: 137 mmol/L (ref 135–145)
Total Bilirubin: 8.9 mg/dL — ABNORMAL HIGH (ref 0.3–1.2)
Total Protein: 6 g/dL — ABNORMAL LOW (ref 6.5–8.1)

## 2015-08-12 LAB — CBG MONITORING, ED: Glucose-Capillary: 71 mg/dL (ref 65–99)

## 2015-08-12 LAB — URINALYSIS, ROUTINE W REFLEX MICROSCOPIC
Glucose, UA: NEGATIVE mg/dL
Hgb urine dipstick: NEGATIVE
KETONES UR: NEGATIVE mg/dL
Nitrite: POSITIVE — AB
PROTEIN: NEGATIVE mg/dL
Specific Gravity, Urine: 1.027 (ref 1.005–1.030)
pH: 6 (ref 5.0–8.0)

## 2015-08-12 LAB — URINE MICROSCOPIC-ADD ON
RBC / HPF: NONE SEEN RBC/hpf (ref 0–5)
Squamous Epithelial / LPF: NONE SEEN

## 2015-08-12 LAB — TSH: TSH: 5.144 u[IU]/mL — ABNORMAL HIGH (ref 0.350–4.500)

## 2015-08-12 LAB — MRSA PCR SCREENING: MRSA BY PCR: POSITIVE — AB

## 2015-08-12 LAB — LACTIC ACID, PLASMA
LACTIC ACID, VENOUS: 2.8 mmol/L — AB (ref 0.5–2.0)
Lactic Acid, Venous: 3 mmol/L (ref 0.5–2.0)

## 2015-08-12 LAB — I-STAT CG4 LACTIC ACID, ED: Lactic Acid, Venous: 2.51 mmol/L (ref 0.5–2.0)

## 2015-08-12 LAB — AMMONIA: AMMONIA: 47 umol/L — AB (ref 9–35)

## 2015-08-12 LAB — PROCALCITONIN: Procalcitonin: 0.14 ng/mL

## 2015-08-12 LAB — CREATININE, SERUM: CREATININE: 0.88 mg/dL (ref 0.44–1.00)

## 2015-08-12 LAB — MAGNESIUM: Magnesium: 1.4 mg/dL — ABNORMAL LOW (ref 1.7–2.4)

## 2015-08-12 LAB — PHOSPHORUS: PHOSPHORUS: 3.6 mg/dL (ref 2.5–4.6)

## 2015-08-12 MED ORDER — ONDANSETRON HCL 4 MG PO TABS: 4.0000 mg | ORAL_TABLET | Freq: Four times a day (QID) | ORAL | Status: DC | PRN

## 2015-08-12 MED ORDER — SODIUM CHLORIDE 0.9 % IV SOLN
INTRAVENOUS | Status: DC
  Administered 2015-08-12 – 2015-08-13 (×2): via INTRAVENOUS

## 2015-08-12 MED ORDER — FOLIC ACID 1 MG PO TABS
1.0000 mg | ORAL_TABLET | Freq: Every day | ORAL | Status: DC
  Administered 2015-08-13 – 2015-08-27 (×15): 1 mg via ORAL
  Filled 2015-08-12 (×16): qty 1

## 2015-08-12 MED ORDER — OXYCODONE HCL 5 MG PO TABS
5.0000 mg | ORAL_TABLET | Freq: Three times a day (TID) | ORAL | Status: DC | PRN
  Administered 2015-08-13 – 2015-08-20 (×6): 5 mg via ORAL
  Filled 2015-08-12 (×7): qty 1

## 2015-08-12 MED ORDER — SODIUM CHLORIDE 0.9 % IV BOLUS (SEPSIS)
1000.0000 mL | Freq: Once | INTRAVENOUS | Status: AC
  Administered 2015-08-12: 1000 mL via INTRAVENOUS

## 2015-08-12 MED ORDER — HEPARIN SODIUM (PORCINE) 5000 UNIT/ML IJ SOLN
5000.0000 [IU] | Freq: Three times a day (TID) | INTRAMUSCULAR | Status: DC
  Administered 2015-08-12 – 2015-08-14 (×4): 5000 [IU] via SUBCUTANEOUS
  Filled 2015-08-12 (×5): qty 1

## 2015-08-12 MED ORDER — PIPERACILLIN-TAZOBACTAM 3.375 G IVPB
3.3750 g | Freq: Once | INTRAVENOUS | Status: AC
  Administered 2015-08-12: 3.375 g via INTRAVENOUS
  Filled 2015-08-12: qty 50

## 2015-08-12 MED ORDER — ZOLPIDEM TARTRATE 5 MG PO TABS
5.0000 mg | ORAL_TABLET | Freq: Once | ORAL | Status: AC
  Administered 2015-08-12: 5 mg via ORAL
  Filled 2015-08-12: qty 1

## 2015-08-12 MED ORDER — VANCOMYCIN HCL IN DEXTROSE 1-5 GM/200ML-% IV SOLN
1000.0000 mg | Freq: Three times a day (TID) | INTRAVENOUS | Status: DC
  Administered 2015-08-12 – 2015-08-14 (×6): 1000 mg via INTRAVENOUS
  Filled 2015-08-12 (×6): qty 200

## 2015-08-12 MED ORDER — DEXTROSE 5 % IV SOLN
1.0000 g | Freq: Three times a day (TID) | INTRAVENOUS | Status: DC
  Administered 2015-08-12 – 2015-08-14 (×4): 1 g via INTRAVENOUS
  Filled 2015-08-12 (×5): qty 1

## 2015-08-12 MED ORDER — GABAPENTIN 400 MG PO CAPS
400.0000 mg | ORAL_CAPSULE | Freq: Once | ORAL | Status: AC
  Administered 2015-08-12: 400 mg via ORAL
  Filled 2015-08-12: qty 1

## 2015-08-12 MED ORDER — VANCOMYCIN HCL IN DEXTROSE 1-5 GM/200ML-% IV SOLN
1000.0000 mg | Freq: Once | INTRAVENOUS | Status: AC
  Administered 2015-08-12: 1000 mg via INTRAVENOUS
  Filled 2015-08-12: qty 200

## 2015-08-12 MED ORDER — SODIUM CHLORIDE 0.9 % IV BOLUS (SEPSIS): 500.0000 mL | Freq: Once | INTRAVENOUS | Status: DC

## 2015-08-12 MED ORDER — SODIUM CHLORIDE 0.9 % IV BOLUS (SEPSIS)
500.0000 mL | Freq: Once | INTRAVENOUS | Status: AC
  Administered 2015-08-12: 500 mL via INTRAVENOUS

## 2015-08-12 MED ORDER — PANTOPRAZOLE SODIUM 40 MG PO TBEC
40.0000 mg | DELAYED_RELEASE_TABLET | Freq: Two times a day (BID) | ORAL | Status: DC
  Administered 2015-08-12 – 2015-08-27 (×30): 40 mg via ORAL
  Filled 2015-08-12 (×30): qty 1

## 2015-08-12 MED ORDER — VITAMIN B-1 100 MG PO TABS
100.0000 mg | ORAL_TABLET | Freq: Every day | ORAL | Status: DC
  Administered 2015-08-12 – 2015-08-27 (×16): 100 mg via ORAL
  Filled 2015-08-12 (×16): qty 1

## 2015-08-12 MED ORDER — LACTULOSE 10 GM/15ML PO SOLN
20.0000 g | Freq: Two times a day (BID) | ORAL | Status: DC
  Administered 2015-08-12 – 2015-08-15 (×6): 20 g via ORAL
  Filled 2015-08-12 (×6): qty 30

## 2015-08-12 MED ORDER — LEVOTHYROXINE SODIUM 50 MCG PO TABS
50.0000 ug | ORAL_TABLET | Freq: Every day | ORAL | Status: DC
  Administered 2015-08-13 – 2015-08-27 (×15): 50 ug via ORAL
  Filled 2015-08-12 (×8): qty 1
  Filled 2015-08-12: qty 2
  Filled 2015-08-12 (×6): qty 1

## 2015-08-12 MED ORDER — ONDANSETRON HCL 4 MG/2ML IJ SOLN: 4.0000 mg | Freq: Four times a day (QID) | INTRAMUSCULAR | Status: DC | PRN

## 2015-08-12 NOTE — ED Notes (Signed)
Patient transported to X-ray 

## 2015-08-12 NOTE — Progress Notes (Signed)
Pharmacy Antibiotic Follow-up Note  Rhonda Fuentes is a 50 y.o. year-old female admitted on 08/12/2015.  The patient is currently on day 1 of Vancomycin & Cefepime for rule out HCAP.  Assessment/Plan: Vancomycin 1 gm IV every 8 hours.  Goal trough 15-20 mcg/mL.  Cefepime 1gm q8hr  Temp (24hrs), Avg:98.3 F (36.8 C), Min:97.8 F (36.6 C), Max:98.7 F (37.1 C)   Recent Labs Lab 08/12/15 1408  WBC 12.4*    Recent Labs Lab 08/12/15 1408  CREATININE 1.01*   Estimated Creatinine Clearance: 80.9 mL/min (by C-G formula based on Cr of 1.01).    No Known Allergies  Antimicrobials this admission: 5/4 Zosyn x1 5/4 Vancomycin (8days) >>  5/4 Cefepime (8days) >>  Levels/dose changes this admission:  Microbiology results: 5/4 BCx: sent 5/4 MRSA PCR: positive  Thank you for allowing pharmacy to be a part of this patient's care.  Otho BellowsGreen, Anella Nakata L PharmD 08/12/2015 6:23 PM

## 2015-08-12 NOTE — ED Notes (Signed)
Patient aware urine sample is needed. Patient states she cannot void at this time.

## 2015-08-12 NOTE — Progress Notes (Addendum)
Pt confirms with ED Cm she does not have a pcp and "know I should be looking to find one" CM offered pt a list [employer] providers within zip [phone]  [facility] CM spoke with pt on how to obtain an in network pcp with insurance coverage via the customer service number or web site  Cm reviewed ED level of care for crisis/emergent services and community pcp level of care to manage continuous or chronic medical concerns.  The pt voiced understanding CM encouraged pt and discussed pt's responsibility to verify with pt's insurance carrier that any recommended medical provider offered by any emergency room or a hospital provider is within the carrier's network. The pt voiced understanding     Entered in d/c instructions  Please use the [employer] resources provided to you to assist with finding a pcp for follow up care Please verify any provider recommended to you is in network Please go to [email or web address], locate find a doctor area in top right corner of page to use to find in network primary care provider and specialists or call the toll free number on the back of your insurance card to speak with a customer service staff

## 2015-08-12 NOTE — ED Notes (Signed)
Patient attempted to provide urine sample. Urine sample not sufficient volume.

## 2015-08-12 NOTE — ED Notes (Signed)
MD at bedside. 

## 2015-08-12 NOTE — ED Notes (Signed)
PATIENT DOES NOT WANT TO BE STUCK MULTIPLE TIMES AND PATIENT HAS SMALL VEINS.

## 2015-08-12 NOTE — Progress Notes (Signed)
CRITICAL VALUE ALERT  Critical value received:  Lactic acid 2.8   Date of notification:  08/12/2015  Time of notification:  1845  Critical value read back:Yes.    Nurse who received alert:  Drue Stageraroline Tracy Gerken, RN  MD notified (1st page):  [place]  Time of first page:  1846  MD notified (2nd page):  Time of second page:  Responding MD:  Gwenlyn PerkingMadera  Time MD responded:  [phone]

## 2015-08-12 NOTE — ED Provider Notes (Signed)
CSN: [id number]     Arrival date & time 08/12/15  1157 History   First MD Initiated Contact with Patient 08/12/15 1307     Chief Complaint  Patient presents with  . Ascites  . Altered Mental Status     (Consider location/radiation/quality/duration/timing/severity/associated sxs/prior Treatment) Patient is a 50 y.o. female presenting with altered mental status. The history is provided by medical records and the patient (Patient has cirrhosis was admitted recently with sepsis due to pneumonia. Was seen in the office for follow-up today and is more confused than normal).  Altered Mental Status Presenting symptoms: confusion   Severity:  Moderate Most recent episode:  Today Episode history:  Continuous Timing:  Constant Progression:  Unchanged Chronicity:  Recurrent Context: not alcohol use     Past Medical History  Diagnosis Date  . Anemia   . Thyroid disease   . Alcohol abuse   . 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 Cholecystectomy    . Gastric bypass    . Ankle surgery     Family History  Problem Relation Age of Onset  . Breast cancer Sister    Social History  Substance Use Topics  . Smoking status: Never Smoker   . Smokeless tobacco: Never Used  . Alcohol Use: 12.0 oz/week    20 Standard drinks or equivalent per week   OB History    No data available     Review of Systems  Unable to perform ROS: Mental status change  Psychiatric/Behavioral: Positive for confusion.      Allergies  Review of patient's allergies indicates no known allergies.  Home Medications   Prior to Admission medications   Medication Sig Start Date End Date Taking? Authorizing Provider  cyclobenzaprine (FLEXERIL) 10 MG tablet Take 5 mg by mouth 2 (two) times daily as needed for muscle spasms.   Yes Historical Provider, MD  Eszopiclone 3 MG TABS Take 3 mg by mouth at bedtime. 07/30/15  Yes Historical Provider, MD  fluticasone  (FLONASE) 50 MCG/ACT nasal spray Place 2 sprays into both nostrils daily as needed for rhinitis.    Yes Historical Provider, MD  folic acid (FOLVITE) 1 MG tablet Take 1 tablet (1 mg total) by mouth daily. 07/19/15  Yes Enid Baas, MD  furosemide (LASIX) 20 MG tablet Take 20 mg by mouth daily. 07/19/15  Yes Historical Provider, MD  gabapentin (NEURONTIN) 400 MG capsule Take 1 capsule (400 mg total) by mouth at bedtime. 07/19/15  Yes Enid Baas, MD  lactulose (CHRONULAC) 10 GM/15ML solution Take 30 mLs (20 g total) by mouth 2 (two) times daily. 07/19/15  Yes Enid Baas, MD  levothyroxine (SYNTHROID, LEVOTHROID) 50 MCG tablet Take 50 mcg by mouth daily before breakfast.    Yes Historical Provider, MD  oxyCODONE (OXY IR/ROXICODONE) 5 MG immediate release tablet Take 5 mg by mouth every 4 (four) hours as needed for severe pain.   Yes Historical Provider, MD  pantoprazole (PROTONIX) 40 MG tablet Take 40 mg by mouth 2 (two) times daily.   Yes Historical Provider, MD  spironolactone (ALDACTONE) 25 MG tablet Take 25 mg by mouth daily. 07/19/15  Yes Historical Provider, MD  thiamine (VITAMIN B-1) 100 MG tablet Take 100 mg by mouth daily.   Yes Historical Provider, MD  levofloxacin (LEVAQUIN) 750 MG tablet Take 1 tablet (750 mg total) by mouth daily. Patient not taking: Reported on 08/12/2015 08/04/15   Shaune Pollack, MD  BP 95/61 mmHg  Pulse 107  Temp(Src) 98.7 F (37.1 C) (Oral)  Resp 16  Ht  (1.727 m)  Wt 205 lb (92.987 kg)  BMI 31.18 kg/m2  SpO2 96%  LMP 10/21/2014 (Approximate) Physical Exam  Constitutional: She is oriented to person, place, and time. She appears well-developed.  HENT:  Head: Normocephalic.  Eyes: Conjunctivae and EOM are normal. Scleral icterus is present.  Neck: Neck supple. No thyromegaly present.  Cardiovascular: Normal rate and regular rhythm.  Exam reveals no gallop and no friction rub.   No murmur heard. Pulmonary/Chest: No stridor. She has no wheezes.  She has no rales. She exhibits no tenderness.  Abdominal: She exhibits no distension. There is no tenderness. There is no rebound.  Musculoskeletal: Normal range of motion. She exhibits no edema.  Lymphadenopathy:    She has no cervical adenopathy.  Neurological: She is oriented to person, place, and time. She exhibits normal muscle tone. Coordination normal.  Skin: No rash noted. No erythema.  Psychiatric: She has a normal mood and affect. Her behavior is normal.    ED Course  Procedures (including critical care time) Labs Review Labs Reviewed  COMPREHENSIVE METABOLIC PANEL - Abnormal; Notable for the following:    Glucose, Bld 103 (*)    Creatinine, Ser 1.01 (*)    Calcium 8.0 (*)    Total Protein 6.0 (*)    Albumin 2.3 (*)    AST 144 (*)    Alkaline Phosphatase 131 (*)    Total Bilirubin 8.9 (*)    All other components within normal limits  AMMONIA - Abnormal; Notable for the following:    Ammonia 47 (*)    All other components within normal limits  CBC WITH DIFFERENTIAL/PLATELET - Abnormal; Notable for the following:    WBC 12.4 (*)    RBC 2.88 (*)    Hemoglobin 11.0 (*)    HCT 31.3 (*)    MCV 108.7 (*)    MCH 38.2 (*)    Neutro Abs 10.6 (*)    All other components within normal limits  I-STAT CG4 LACTIC ACID, ED - Abnormal; Notable for the following:    Lactic Acid, Venous 2.51 (*)    All other components within normal limits  CULTURE, BLOOD (ROUTINE X 2)  CULTURE, BLOOD (ROUTINE X 2)  URINE CULTURE  URINALYSIS, ROUTINE W REFLEX MICROSCOPIC (NOT AT [facility])  CBG MONITORING, ED    Imaging Review Dg Chest 2 View  08/12/2015  CLINICAL DATA:  Patient states she has been on and off with SOB and coughing for months. Hx of childhood asthma, pneumonia, and liver disease. EXAM: CHEST  2 VIEW COMPARISON:  08/02/2015 FINDINGS: Right upper lobe pneumonia has improved, with less extensive upper lobe consolidation that was present on the prior exam. Linear opacity in the left mid  lung is stable which may reflect additional infection or atelectasis. Small areas of airspace opacity projects in the right lower lobe, not evident on the prior study. This may reflect new areas of infection or atelectasis. No pulmonary edema.  No pneumothorax or pleural effusion. Cardiac silhouette is normal in size and configuration. No mediastinal or hilar masses. IMPRESSION: 1. Interval improvement in the right upper lobe pneumonia although significant airspace opacification persists in the central right upper lobe. 2. Small patchy areas of airspace opacity in the right lower lobe are new, which may reflect new areas of pneumonia or be due to atelectasis. 3. Stable linear opacity in the left mid lung,  which is likely atelectasis. Electronically Signed   By: Amie Portlandavid  Ormond M.D.   On: 08/12/2015 14:41   I have personally reviewed and evaluated these images and lab results as part of my medical decision-making.   EKG Interpretation None     CRITICAL CARE Performed by: Jocilynn Grade L Total critical care time:40  minutes Critical care time was exclusive of separately billable procedures and treating other patients. Critical care was necessary to treat or prevent imminent or life-threatening deterioration. Critical care was time spent personally by me on the following activities: development of treatment plan with patient and/or surrogate as well as nursing, discussions with consultants, evaluation of patient's response to treatment, examination of patient, obtaining history from patient or surrogate, ordering and performing treatments and interventions, ordering and review of laboratory studies, ordering and review of radiographic studies, pulse oximetry and re-evaluation of patient's condition.  MDM   Final diagnoses:  Altered mental status, unspecified altered mental status type     Patient with liver failure altered mental status and possible recurrence of pneumonia. She will be admitted to  medicine with a GI consult    Bethann BerkshireJoseph Demarkus Remmel, MD 08/12/15 [phone]

## 2015-08-12 NOTE — ED Notes (Signed)
PT aware of urine sample 

## 2015-08-12 NOTE — ED Notes (Signed)
Pt c/o jaundice x 4 weeks s/t unspecified alcoholic liver disease, edema to abdomen, fingers, toes, tingling pain in fingers, confusion. Patient has difficulty completing coherent line of thought, states this is new after most recent round of medication.

## 2015-08-12 NOTE — H&P (Signed)
History and Physical    Rhonda Fuentes AGT:[id number] DOB: 07-24-1965 DOA: 08/12/2015  Referring Provider: Dr. Roderic Palau PCP: No PCP Per Patient  Outpatient Specialists:  [facility]  Patient coming from: home. Lives with her husband  Chief Complaint: tachypnea, worsening edema, confusion and jaundice.  HPI: Rhonda Fuentes is a 50 y.o. female with PMH significant for alcoholic cirrhosis, hypothyroidism, chronic pain syndrome and recent admission due to PNA and encephalopathy (admitted to [facility]). Patient presented from GI office due to ongoing jaundice, confusion, low BP and difficulty breathing. In ED found to have abnormal CXR (with new infiltrates component) and features of sepsis. Patient endorses swelling of her legs and abdomen. Denies CP, fever, chills, hematemesis, melena, hematochezia and dysuria. She endorses some dry cough and being compliant with medication regimen. Of note, last alcohol was approx 3 weeks ago (approx 2 weeks prior to last admission).  ED Course: started on IVF's and broad spectrum antibiotics. Blood cx's and urine culture taken. [facility] called to admit patient for sepsis. CXR with new right lower lobe infiltrates. Met sepsis criteria with tachypnea, tachycardia, elevated WBC's, positive lactic acid and abnormal CXR/encephalopathy   Review of Systems:  All other systems reviewed and apart from HPI, are negative.  Past Medical History  Diagnosis Date  . Anemia   . Thyroid disease   . Alcohol abuse   . 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 Cholecystectomy    . Gastric bypass    . Ankle surgery       reports that she has never smoked. She has never used smokeless tobacco. She reports that she drinks about 12.0 oz of alcohol per week. She reports that she uses illicit drugs (Marijuana). (last drink about 3 weeks ago)  No Known Allergies  Family History  Problem Relation Age of Onset  . Breast cancer Sister      Prior to Admission medications   Medication Sig Start Date End Date Taking? Authorizing Provider  cyclobenzaprine (FLEXERIL) 10 MG tablet Take 5 mg by mouth 2 (two) times daily as needed for muscle spasms.   Yes Historical Provider, MD  Eszopiclone 3 MG TABS Take 3 mg by mouth at bedtime. 07/30/15  Yes Historical Provider, MD  fluticasone (FLONASE) 50 MCG/ACT nasal spray Place 2 sprays into both nostrils daily as needed for rhinitis.    Yes Historical Provider, MD  folic acid (FOLVITE) 1 MG tablet Take 1 tablet (1 mg total) by mouth daily. 07/19/15  Yes Gladstone Lighter, MD  furosemide (LASIX) 20 MG tablet Take 20 mg by mouth daily. 07/19/15  Yes Historical Provider, MD  gabapentin (NEURONTIN) 400 MG capsule Take 1 capsule (400 mg total) by mouth at bedtime. 07/19/15  Yes Gladstone Lighter, MD  lactulose (CHRONULAC) 10 GM/15ML solution Take 30 mLs (20 g total) by mouth 2 (two) times daily. 07/19/15  Yes Gladstone Lighter, MD  levothyroxine (SYNTHROID, LEVOTHROID) 50 MCG tablet Take 50 mcg by mouth daily before breakfast.    Yes Historical Provider, MD  oxyCODONE (OXY IR/ROXICODONE) 5 MG immediate release tablet Take 5 mg by mouth every 4 (four) hours as needed for severe pain.   Yes Historical Provider, MD  pantoprazole (PROTONIX) 40 MG tablet Take 40 mg by mouth 2 (two) times daily.   Yes Historical Provider, MD  spironolactone (ALDACTONE) 25 MG tablet Take 25 mg by mouth daily. 07/19/15  Yes Historical Provider, MD  thiamine (VITAMIN B-1) 100 MG tablet  Take 100 mg by mouth daily.   Yes Historical Provider, MD    Physical Exam: Filed Vitals:   08/12/15 1555 08/12/15 1600 08/12/15 1700 08/12/15 1810  BP: 105/77 101/67 142/108 97/47  Pulse: 109 105 101 103  Temp:  97.8 F (36.6 C)    TempSrc:      Resp: 22 25 18 19   Height:  5' 8"  (1.727 m)    Weight:  94.3 kg (207 lb 14.3 oz)    SpO2: 100% 98% 100% 100%    Constitutional: in no distress; confused and with impaired insight; AAOX2; no  fever on exam and w/o CP. Patient with jaundice and signs of edema. Eyes: PERRLA, lids and conjunctivae normal; positive icterus ENMT: Mucous membranes were moist. Posterior pharynx clear of any exudate or lesions. Normal dentition. No thrush Neck: normal, supple, no masses, no thyromegaly Respiratory: tachypeneic, positive rhonchi and decrease BS at bases, no wheezing Cardiovascular: S1 & S2 heard, regular rate and rhythm, no murmurs / rubs / gallops. 2+ pedal pulses. Abdomen: positive distension, mild tenderness with deep palpation, no masses palpated. No hepatosplenomegaly. Bowel sounds normal. Positive wave fluid from ascites  Musculoskeletal: no clubbing / cyanosis.positive 2-3++ edema  Skin: no petechiae, no rash. Positive icterus/jaundice Neurologic: CN 2-12 grossly intact. Sensation intact, DTR normal. Strength 5/5 in all 4 limbs.  Psychiatric: patient confused and with poor insight currently. Alert and oriented x 2. Normal mood; no SI or hallucinations.  Labs on Admission: I have personally reviewed following labs and imaging studies  CBC:  Recent Labs Lab 08/12/15 1408  WBC 12.4*  NEUTROABS 10.6*  HGB 11.0*  HCT 31.3*  MCV 108.7*  PLT 026   Basic Metabolic Panel:  Recent Labs Lab 08/12/15 1408  NA 137  K 3.5  CL 103  CO2 22  GLUCOSE 103*  BUN 9  CREATININE 1.01*  CALCIUM 8.0*   GFR: Estimated Creatinine Clearance: 80.9 mL/min (by C-G formula based on Cr of 1.01).   Liver Function Tests:  Recent Labs Lab 08/12/15 1408  AST 144*  ALT 38  ALKPHOS 131*  BILITOT 8.9*  PROT 6.0*  ALBUMIN 2.3*    Recent Labs Lab 08/12/15 1408  AMMONIA 47*   CBG:  Recent Labs Lab 08/12/15 1315  GLUCAP 71   Urine analysis:    Component Value Date/Time   COLORURINE AMBER* 08/02/2015 1855   APPEARANCEUR HAZY* 08/02/2015 1855   LABSPEC 1.009 08/02/2015 1855   PHURINE 6.0 08/02/2015 1855   GLUCOSEU 50* 08/02/2015 1855   HGBUR NEGATIVE 08/02/2015 1855    BILIRUBINUR NEGATIVE 08/02/2015 1855   KETONESUR NEGATIVE 08/02/2015 1855   PROTEINUR NEGATIVE 08/02/2015 1855   NITRITE NEGATIVE 08/02/2015 1855   LEUKOCYTESUR 3+* 08/02/2015 1855   Sepsis Labs: Lactic acid 2.5>>>2.8  Recent Results (from the past 240 hour(s))  Blood culture (routine x 2)     Status: None   Collection Time: 08/02/15  6:45 PM  Result Value Ref Range Status   Specimen Description BLOOD LEFT ASSIST CONTROL  Final   Special Requests BOTTLES DRAWN AEROBIC AND ANAEROBIC 5CC  Final   Culture NO GROWTH 5 DAYS  Final   Report Status 08/07/2015 FINAL  Final  Blood culture (routine x 2)     Status: None   Collection Time: 08/02/15  6:55 PM  Result Value Ref Range Status   Specimen Description BLOOD LEFT ASSIST CONTROL  Final   Special Requests BOTTLES DRAWN AEROBIC AND ANAEROBIC  5CC  Final   Culture  NO GROWTH 5 DAYS  Final   Report Status 08/07/2015 FINAL  Final  Rapid Influenza A&B Antigens ([facility] only)     Status: None   Collection Time: 08/02/15  7:32 PM  Result Value Ref Range Status   Influenza A ([facility]) NEGATIVE NEGATIVE Final   Influenza B ([facility]) NEGATIVE NEGATIVE Final  MRSA PCR Screening     Status: Abnormal   Collection Time: 08/12/15  4:45 PM  Result Value Ref Range Status   MRSA by PCR POSITIVE (A) NEGATIVE Final    Comment:        The GeneXpert MRSA Assay (FDA approved for NASAL specimens only), is one component of a comprehensive MRSA colonization surveillance program. It is not intended to diagnose MRSA infection nor to guide or monitor treatment for MRSA infections. RESULT CALLED TO, READ BACK BY AND VERIFIED WITH: CAROLYN CREECH,RN [id number] @ 9150 BY J SCOTTON      Radiological Exams on Admission: Dg Chest 2 View  08/12/2015  CLINICAL DATA:  Patient states she has been on and off with SOB and coughing for months. Hx of childhood asthma, pneumonia, and liver disease. EXAM: CHEST  2 VIEW COMPARISON:  08/02/2015 FINDINGS: Right upper lobe pneumonia  has improved, with less extensive upper lobe consolidation that was present on the prior exam. Linear opacity in the left mid lung is stable which may reflect additional infection or atelectasis. Small areas of airspace opacity projects in the right lower lobe, not evident on the prior study. This may reflect new areas of infection or atelectasis. No pulmonary edema.  No pneumothorax or pleural effusion. Cardiac silhouette is normal in size and configuration. No mediastinal or hilar masses. IMPRESSION: 1. Interval improvement in the right upper lobe pneumonia although significant airspace opacification persists in the central right upper lobe. 2. Small patchy areas of airspace opacity in the right lower lobe are new, which may reflect new areas of pneumonia or be due to atelectasis. 3. Stable linear opacity in the left mid lung, which is likely atelectasis. Electronically Signed   By: Lajean Manes M.D.   On: 08/12/2015 14:41    EKG:  None  Assessment/Plan 1-Sepsis [facility]): with concerns for HCAP and/or SBP. -patient recently admitted for PNA and hepatic encephalopathy -patient completed treatment according to instructions; but presented still with SOB, tachypnea, elevated WBC's and soft BP. -patient found to have elevated lactic acid and CXR demonstrating new right lower lobe infiltrates. -also with ascites and concerns for SBP -will initiate sepsis protocol, admit to stepdown; check procalcitonin and trend lactic acid -Vanc and cefepime per pharmacy has been ordered -IR to help with paracentesis and will follow culture data    2-Alcoholic hepatitis/cirrhosis with ascites: -as mentioned above will perform paracentesis -GI was consulted  To assist with treatment/care of her cirrhosis  -for now given low BP, will hold spironolactone and lasix -low sodium diet ordered  3-hepatic encephalopathy: most likely; but possible toxic encephalopathy also in the differential  -will continue  lactulose -patient ammonia 47 on admission -she reported not taking lactulose as prescribed prior to admission and was achieving 1 BM every 2 days -will resume lactulose as initially prescribed and adjust further as needed -might benefit of rifaximin as well if lactulose failed to do the job alone  4-Hypothyroidism: -will check TSH -continue synthroid  5-GERD (gastroesophageal reflux disease): -continue PPI  6-chronic pain syndrome: -will decrease/minimize narcotics currently given low BP and encephalopathy    DVT prophylaxis: heparin  Code Status: Full code  Family Communication: no family at bedside  Disposition Plan: to be determined; PT evaluation once medically stable; currently will treat her for sepsis and follow GI rec's as part of treatment for her cirrhosis. Consults called: ED has consulted [facility] (patient will be seen in am) Admission status: inpatient, stepdown, LOS > 2 midnights  Time: 70 minutes  Barton Dubois MD [facility] Pager [phone]  If 7PM-7AM, please contact night-coverage [email or web address] Password [facility]  08/12/2015, 6:32 PM

## 2015-08-13 ENCOUNTER — Inpatient Hospital Stay (HOSPITAL_COMMUNITY): Payer: BLUE CROSS/BLUE SHIELD

## 2015-08-13 LAB — VANCOMYCIN, TROUGH: Vancomycin Tr: 32 ug/mL (ref 10.0–20.0)

## 2015-08-13 LAB — BODY FLUID CELL COUNT WITH DIFFERENTIAL
LYMPHS FL: 40 %
MONOCYTE-MACROPHAGE-SEROUS FLUID: 48 % — AB (ref 50–90)
Neutrophil Count, Fluid: 12 % (ref 0–25)
WBC FLUID: 72 uL (ref 0–1000)

## 2015-08-13 LAB — GRAM STAIN

## 2015-08-13 LAB — COMPREHENSIVE METABOLIC PANEL
ALBUMIN: 1.8 g/dL — AB (ref 3.5–5.0)
ALT: 32 U/L (ref 14–54)
AST: 122 U/L — AB (ref 15–41)
Alkaline Phosphatase: 111 U/L (ref 38–126)
Anion gap: 10 (ref 5–15)
BUN: 9 mg/dL (ref 6–20)
CHLORIDE: 109 mmol/L (ref 101–111)
CO2: 18 mmol/L — ABNORMAL LOW (ref 22–32)
Calcium: 7.3 mg/dL — ABNORMAL LOW (ref 8.9–10.3)
Creatinine, Ser: 0.92 mg/dL (ref 0.44–1.00)
GFR calc Af Amer: >60 mL/min (ref >60)
GFR calc non Af Amer: >60 mL/min (ref >60)
GLUCOSE: 128 mg/dL — AB (ref 65–99)
POTASSIUM: 3.2 mmol/L — AB (ref 3.5–5.1)
Sodium: 137 mmol/L (ref 135–145)
Total Bilirubin: 7 mg/dL — ABNORMAL HIGH (ref 0.3–1.2)
Total Protein: 4.8 g/dL — ABNORMAL LOW (ref 6.5–8.1)

## 2015-08-13 LAB — MAGNESIUM: Magnesium: 1.3 mg/dL — ABNORMAL LOW (ref 1.7–2.4)

## 2015-08-13 LAB — LACTIC ACID, PLASMA
LACTIC ACID, VENOUS: 2.1 mmol/L — AB (ref 0.5–2.0)
LACTIC ACID, VENOUS: 1.7 mmol/L (ref 0.5–2.0)

## 2015-08-13 LAB — LACTATE DEHYDROGENASE, PLEURAL OR PERITONEAL FLUID: LD FL: 34 U/L — AB (ref 3–23)

## 2015-08-13 LAB — STREP PNEUMONIAE URINARY ANTIGEN: STREP PNEUMO URINARY ANTIGEN: NEGATIVE

## 2015-08-13 MED ORDER — SODIUM CHLORIDE 0.9% FLUSH
10.0000 mL | INTRAVENOUS | Status: DC | PRN
  Administered 2015-08-15 – 2015-08-21 (×3): 10 mL
  Administered 2015-08-25: 20 mL
  Filled 2015-08-13 (×4): qty 40

## 2015-08-13 MED ORDER — HYDROCORTISONE NA SUCCINATE PF 100 MG IJ SOLR
50.0000 mg | Freq: Three times a day (TID) | INTRAMUSCULAR | Status: DC
  Administered 2015-08-13 – 2015-08-16 (×8): 50 mg via INTRAVENOUS
  Filled 2015-08-13 (×8): qty 2

## 2015-08-13 MED ORDER — ZOLPIDEM TARTRATE 5 MG PO TABS
5.0000 mg | ORAL_TABLET | Freq: Every evening | ORAL | Status: DC | PRN
  Administered 2015-08-13 – 2015-08-27 (×5): 5 mg via ORAL
  Filled 2015-08-13 (×5): qty 1

## 2015-08-13 MED ORDER — VANCOMYCIN HCL 500 MG IV SOLR
500.0000 mg | Freq: Once | INTRAVENOUS | Status: AC
  Administered 2015-08-13: 500 mg via INTRAVENOUS
  Filled 2015-08-13: qty 500

## 2015-08-13 MED ORDER — LIP MEDEX EX OINT
TOPICAL_OINTMENT | CUTANEOUS | Status: AC
  Administered 2015-08-13: 21:00:00
  Filled 2015-08-13: qty 7

## 2015-08-13 MED ORDER — MAGNESIUM SULFATE 2 GM/50ML IV SOLN
2.0000 g | Freq: Once | INTRAVENOUS | Status: AC
  Administered 2015-08-13: 2 g via INTRAVENOUS
  Filled 2015-08-13: qty 50

## 2015-08-13 MED ORDER — VITAMINS A & D EX OINT
TOPICAL_OINTMENT | CUTANEOUS | Status: AC
Start: 2015-08-13 — End: 2015-08-13
  Administered 2015-08-13
  Filled 2015-08-13: qty 5

## 2015-08-13 MED ORDER — POTASSIUM CHLORIDE CRYS ER 20 MEQ PO TBCR
40.0000 meq | EXTENDED_RELEASE_TABLET | Freq: Once | ORAL | Status: AC
  Administered 2015-08-13: 40 meq via ORAL
  Filled 2015-08-13: qty 2

## 2015-08-13 MED ORDER — SODIUM CHLORIDE 0.9% FLUSH
10.0000 mL | Freq: Two times a day (BID) | INTRAVENOUS | Status: DC
  Administered 2015-08-13 – 2015-08-27 (×6): 10 mL

## 2015-08-13 MED ORDER — SODIUM CHLORIDE 0.9 % IV BOLUS (SEPSIS)
500.0000 mL | Freq: Once | INTRAVENOUS | Status: AC
  Administered 2015-08-13: 500 mL via INTRAVENOUS

## 2015-08-13 NOTE — Progress Notes (Signed)
Per night shift RN. Lab unable to obtain any blood draws successfully from patient. MD made aware this morning that Lactic Acid and other labs have not been drawn. MD to add order for PICC line.

## 2015-08-13 NOTE — Progress Notes (Signed)
Pharmacy Antibiotic Follow-up Note  Rhonda Fuentes is a 50 y.o. year-old female admitted on 08/12/2015.  The patient is currently on day 2 of Vancomycin & Cefepime for sepsis secondary to PNA.   Assessment/Plan: Vancomycin 1 gm IV every 8 hours.  Goal trough 15-20 mcg/mL.  Cefepime 1g IV q8h Check VT tonight at [facility]  Temp (24hrs), Avg:98.2 F (36.8 C), Min:97.8 F (36.6 C), Max:98.5 F (36.9 C)   Recent Labs Lab 08/12/15 1408  WBC 12.4*     Recent Labs Lab 08/12/15 1408 08/12/15 2053  CREATININE 1.01* 0.88   Estimated Creatinine Clearance: 94.4 mL/min (by C-G formula based on Cr of 0.88).    No Known Allergies  Antimicrobials this admission: 5/4 Zosyn x1 5/4 Vancomycin (8days) >>  5/4 Cefepime (8days) >>  Levels/dose changes this admission: 5/5 VT = _____  Microbiology results: 5/4 BCx: sent 5/4 MRSA PCR: positive 5/4 Urine: IP 5/5: Peritoneal fluid: IP 5/4: Strep PNeumo: Neg   Thank you for allowing pharmacy to be a part of this patient's care.  Haynes Hoehnolleen Franky Reier, PharmD, BCPS 08/13/2015, 12:48 PM  Pager: [phone]

## 2015-08-13 NOTE — Progress Notes (Signed)
Peripherally Inserted Central Catheter/Midline Placement  The IV Nurse has discussed with the patient and/or persons authorized to consent for the patient, the purpose of this procedure and the potential benefits and risks involved with this procedure.  The benefits include less needle sticks, lab draws from the catheter and patient may be discharged home with the catheter.  Risks include, but not limited to, infection, bleeding, blood clot (thrombus formation), and puncture of an artery; nerve damage and irregular heat beat.  Alternatives to this procedure were also discussed.  PICC/Midline Placement Documentation        Rhonda Fuentes, Rhonda Fuentes 08/13/2015, 6:02 PM

## 2015-08-13 NOTE — Procedures (Signed)
Ultrasound-guided diagnostic and therapeutic paracentesis performed yielding 1.1  liters of clear, golden yellow  fluid. No immediate complications. The fluid was submitted to the laboratory for preordered studies.

## 2015-08-13 NOTE — Progress Notes (Signed)
PROGRESS NOTE  Rhonda Fuentes ZOX:[id number] DOB: 08-02-1965 DOA: 08/12/2015 PCP: No PCP Per Patient  HPI/Recap of past 24 hours: Slight confusion, denies pain, reported feeling better  Assessment/Plan: Principal Problem:   Sepsis (HCC) Active Problems:   Alcoholic hepatitis with ascites   Pneumonia   Encephalopathy, hepatic (HCC)   Alcoholic cirrhosis (HCC)   Lactic acidosis   Hypothyroidism   GERD (gastroesophageal reflux disease)   HCAP (healthcare-associated pneumonia)  1-Sepsis (HCC): with concerns for HCAP  -patient recently admitted for PNA and hepatic encephalopathy -patient completed treatment according to instructions; but presented still with SOB, tachypnea, elevated WBC's and soft BP. -patient found to have elevated lactic acid and CXR demonstrating new right lower lobe infiltrates. - sepsis protocol, admit to stepdown; check procalcitonin and trend lactic acid -Vanc and cefepime per pharmacy has been ordered    2-Alcoholic hepatitis/cirrhosis with ascites: s/p paracentesis, fluids:  wbc 72, gram stain no organism seen , no ab pain, less likely sbp. -GI was consulted To assist with treatment/care of her cirrhosis  -low sodium diet ordered  3-hepatic encephalopathy: most likely; but possible toxic encephalopathy also in the differential  -will continue lactulose -patient ammonia 47 on admission -she reported not taking lactulose as prescribed prior to admission and was achieving 1 BM every 2 days -will resume lactulose as initially prescribed and adjust further as needed -might benefit of rifaximin as well if lactulose failed to do the job alone  4-Hypothyroidism: -will check TSH -continue synthroid  5-GERD (gastroesophageal reflux disease): -continue PPI  6-chronic pain syndrome: - decrease/minimize narcotics currently given low BP and encephalopathy   DVT prophylaxis: heparin  Code Status: Full code Family Communication: no family at bedside    Disposition Plan: to be determined; PT evaluation once medically stable; currently will treat her for sepsis and follow GI rec's as part of treatment for her cirrhosis. Consults called: ED has consulted [facility]   Procedures:  [place] guided paracentesis  Antibiotics:  vanc/cefepime   Objective: BP 119/103 mmHg  Pulse 102  Temp(Src) 98.5 F (36.9 C) (Oral)  Resp 30  Ht 5\' 8"  (1.727 m)  Wt 97.3 kg (214 lb 8.1 oz)  BMI 32.62 kg/m2  SpO2 100%  LMP 10/21/2014 (Approximate)  Intake/Output Summary (Last 24 hours) at 08/13/15 1005 Last data filed at 08/13/15 0800  Gross per 24 hour  Intake   4265 ml  Output    375 ml  Net   3890 ml   Filed Weights   08/12/15 1337 08/12/15 1600 08/13/15 0500  Weight: 92.987 kg (205 lb) 94.3 kg (207 lb 14.3 oz) 97.3 kg (214 lb 8.1 oz)    Exam:   General:  fail  Cardiovascular: sinus tachycardia  Respiratory: diminished at basis, no wheezing  Abdomen: distended, but soft, NT, positive BS  Musculoskeletal:diffuse 3+bilateral lower extremity Edema  Neuro: slight confusion  Data Reviewed: Basic Metabolic Panel:  Recent Labs Lab 08/12/15 1408 08/12/15 2053  NA 137  --   K 3.5  --   CL 103  --   CO2 22  --   GLUCOSE 103*  --   BUN 9  --   CREATININE 1.01* 0.88  CALCIUM 8.0*  --   MG  --  1.4*  PHOS  --  3.6   Liver Function Tests:  Recent Labs Lab 08/12/15 1408  AST 144*  ALT 38  ALKPHOS 131*  BILITOT 8.9*  PROT 6.0*  ALBUMIN 2.3*   No results for input(s): LIPASE, AMYLASE  in the last 168 hours.  Recent Labs Lab 08/12/15 1408  AMMONIA 47*   CBC:  Recent Labs Lab 08/12/15 1408  WBC 12.4*  NEUTROABS 10.6*  HGB 11.0*  HCT 31.3*  MCV 108.7*  PLT 236   Cardiac Enzymes:   No results for input(s): CKTOTAL, CKMB, CKMBINDEX, TROPONINI in the last 168 hours. BNP (last 3 results) No results for input(s): BNP in the last 8760 hours.  ProBNP (last 3 results) No results for input(s): PROBNP in the last 8760  hours.  CBG:  Recent Labs Lab 08/12/15 1315  GLUCAP 71    Recent Results (from the past 240 hour(s))  MRSA PCR Screening     Status: Abnormal   Collection Time: 08/12/15  4:45 PM  Result Value Ref Range Status   MRSA by PCR POSITIVE (A) NEGATIVE Final    Comment:        The GeneXpert MRSA Assay (FDA approved for NASAL specimens only), is one component of a comprehensive MRSA colonization surveillance program. It is not intended to diagnose MRSA infection nor to guide or monitor treatment for MRSA infections. RESULT CALLED TO, READ BACK BY AND VERIFIED WITH: CAROLYN CREECH,RN [id number] @ 1805 BY J SCOTTON      Studies: Dg Chest 2 View  08/12/2015  CLINICAL DATA:  Patient states she has been on and off with SOB and coughing for months. Hx of childhood asthma, pneumonia, and liver disease. EXAM: CHEST  2 VIEW COMPARISON:  08/02/2015 FINDINGS: Right upper lobe pneumonia has improved, with less extensive upper lobe consolidation that was present on the prior exam. Linear opacity in the left mid lung is stable which may reflect additional infection or atelectasis. Small areas of airspace opacity projects in the right lower lobe, not evident on the prior study. This may reflect new areas of infection or atelectasis. No pulmonary edema.  No pneumothorax or pleural effusion. Cardiac silhouette is normal in size and configuration. No mediastinal or hilar masses. IMPRESSION: 1. Interval improvement in the right upper lobe pneumonia although significant airspace opacification persists in the central right upper lobe. 2. Small patchy areas of airspace opacity in the right lower lobe are new, which may reflect new areas of pneumonia or be due to atelectasis. 3. Stable linear opacity in the left mid lung, which is likely atelectasis. Electronically Signed   By: Amie Portland M.D.   On: 08/12/2015 14:41   Dg Chest Port 1 View  08/12/2015  CLINICAL DATA:  Ascites.  Nonsmoker. EXAM: PORTABLE CHEST 1 VIEW  COMPARISON:  08/12/2015 at 1425 hours. FINDINGS: There are low lung volumes. Consolidation in the right upper lobe is similar to prior study. There is linear atelectasis in the left mid lung and probable mild atelectasis in the medial lung bases, also stable. No convincing new lung abnormalities. No pleural effusion.  No pneumothorax. Cardiac silhouette is normal in size. No mediastinal or hilar masses or convincing adenopathy. IMPRESSION: 1. No change from the earlier exam. 2. Right upper lobe pneumonia. 3. Left mid lung discoid atelectasis.  Mild basilar atelectasis. Electronically Signed   By: Amie Portland M.D.   On: 08/12/2015 18:42    Scheduled Meds: . ceFEPime (MAXIPIME) IV  1 g Intravenous Q8H  . folic acid  1 mg Oral Daily  . heparin  5,000 Units Subcutaneous Q8H  . lactulose  20 g Oral BID  . levothyroxine  50 mcg Oral QAC breakfast  . pantoprazole  40 mg Oral BID  . thiamine  100 mg Oral Daily  . vancomycin  1,000 mg Intravenous Q8H    Continuous Infusions: . sodium chloride 100 mL/hr at 08/13/15 0538     Time spent:  Virgel Haro MD, PhD  [facility] Pager [phone]. If 7PM-7AM, please contact night-coverage at [email or web address], password [facility] 08/13/2015, 10:05 AM  LOS: 1 day

## 2015-08-13 NOTE — Progress Notes (Signed)
Pharmacy - Vancomycin dosing  Assessment:    Please see note from Haynes Hoehnolleen Summe, PharmD earlier today for full details.  Briefly, 50 y.o. female on vancomycin 1g q8 for PNA/sepsis   IV access lost prior to completion of vancomycin dose (about 50% infused per RN).  IV team called to place PICC  Plan:   Will cancel VT for now.Will give 500 mg once access regained  Given q8 schedule, probably need to wait for 2 complete 1g  doses before rescheduling VT.   Bernadene Personrew Jeroline Wolbert, PharmD, BCPS Pager: [phone] 08/13/2015, 4:30 PM

## 2015-08-13 NOTE — Consult Note (Signed)
Subjective:   HPI  The patient is a 50 year old female with a significant history of alcohol overuse for several years. She was brought into the hospital and admitted because of worsening edema and progressive confusion and jaundice. The patient tells me that she has drank alcohol for years. Over the past several weeks she has been noticing worsening peripheral edema, increasing abdominal distention, and bouts of confusion. Her sister who she called on the phone while I was there told me that sometimes her confusion spells were quite severe even to a point of where she was hallucinating.  She denies vomiting or hematemesis or melena.  In reviewing the records on April 6 A hepatitis B surface antigen and hepatitis C antibody were negative. Her serum ammonia level yesterday was somewhat elevated at 47. Total bilirubin at last check was 8.9, alkaline phosphatase 131, AST 144, ALT 38.  She tells me that her last drink of alcohol was on March 29.  A CT of the abdomen done on April 7 showed severe steatosis and hepatomegaly. Esophagogastric varices. And there was some thickening in the ascending colon of uncertain etiology  She had a paracentesis today with removal of 1.1 L of clear fluid.  Recent chest x-ray shows evidence of pneumonia  Review of Systems Denies chest pain or shortness of breath  Past Medical History  Diagnosis Date  . Anemia   . Thyroid disease   . Alcohol abuse   . 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 Cholecystectomy    . Gastric bypass    . Ankle surgery     Social History   Social History  . Marital Status: Single    Spouse Name: N/A  . Number of Children: N/A  . Years of Education: N/A   Occupational History  . Not on file.   Social History Main Topics  . Smoking status: Never Smoker   . Smokeless tobacco: Never Used  . Alcohol Use: 12.0 oz/week    20 Standard drinks or equivalent per week  . Drug  Use: Yes    Special: Marijuana  . Sexual Activity: Not Currently   Other Topics Concern  . Not on file   Social History Narrative   family history includes Breast cancer in her sister.  Current facility-administered medications:  .  0.9 %  sodium chloride infusion, , Intravenous, Continuous, Vassie Lollarlos Madera, MD, Stopped at 08/13/15 1148 .  ceFEPIme (MAXIPIME) 1 g in dextrose 5 % 50 mL IVPB, 1 g, Intravenous, Q8H, Vassie Lollarlos Madera, MD, 1 g at 08/13/15 [phone] .  folic acid (FOLVITE) tablet 1 mg, 1 mg, Oral, Daily, Vassie Lollarlos Madera, MD, 1 mg at 08/13/15 1106 .  heparin injection 5,000 Units, 5,000 Units, Subcutaneous, Q8H, Vassie Lollarlos Madera, MD, 5,000 Units at 08/13/15 [phone] .  lactulose (CHRONULAC) 10 GM/15ML solution 20 g, 20 g, Oral, BID, Vassie Lollarlos Madera, MD, 20 g at 08/13/15 1106 .  levothyroxine (SYNTHROID, LEVOTHROID) tablet 50 mcg, 50 mcg, Oral, QAC breakfast, Vassie Lollarlos Madera, MD, 50 mcg at 08/13/15 0726 .  ondansetron (ZOFRAN) tablet 4 mg, 4 mg, Oral, Q6H PRN **OR** ondansetron (ZOFRAN) injection 4 mg, 4 mg, Intravenous, Q6H PRN, Vassie Lollarlos Madera, MD .  oxyCODONE (Oxy IR/ROXICODONE) immediate release tablet 5 mg, 5 mg, Oral, Q8H PRN, Vassie Lollarlos Madera, MD, 5 mg at 08/13/15 0039 .  pantoprazole (PROTONIX) EC tablet 40 mg, 40 mg, Oral, BID, Vassie Lollarlos Madera, MD, 40 mg at 08/13/15 1106 .  thiamine (VITAMIN  B-1) tablet 100 mg, 100 mg, Oral, Daily, Vassie Loll, MD, 100 mg at 08/13/15 1106 .  vancomycin (VANCOCIN) IVPB 1000 mg/200 mL premix, 1,000 mg, Intravenous, Q8H, Otho Bellows, RPH, 1,000 mg at 08/13/15 1105 No Known Allergies   Objective:     BP 96/49 mmHg  Pulse 102  Temp(Src) 98.5 F (36.9 C) (Oral)  Resp 30  Ht  (1.727 m)  Wt 97.3 kg (214 lb 8.1 oz)  BMI 32.62 kg/m2  SpO2 100%  LMP 10/21/2014 (Approximate)  She is alert. She is in no acute distress.  Scleral icterus  Heart regular rhythm no murmurs  Lungs clear  Abdomen: Ascites, soft, nontender  Laboratory No components found  for: D1    Assessment:     Alcoholic liver disease  Evidence of portal hypertension with varices seen on CT scan and presence of ascites  Pneumonia  Hepatic encephalopathy  Ascites      Plan:     Continue to treat clinically. Agree with use of lactulose for hepatic encephalopathy. Check ascites analysis. Sodium restriction to a 2 g sodium diet would be advisable. I would recommend beginning Aldactone and Lasix combination for peripheral edema and ascites. We will follow clinically with you.

## 2015-08-13 NOTE — Care Management Note (Signed)
Case Management Note  Patient Details  Name: Rhonda Fuentes MRN: [id number] Date of Birth: [birth year]  Subjective/Objective:     ams with hepatic                Action/Plan:   Expected Discharge Date:   (UNKNOWN)               Expected Discharge Plan:  Home/Self Care  In-House Referral:  NA  Discharge planning Services  CM Consult  Post Acute Care Choice:  NA Choice offered to:  NA  DME Arranged:  N/A DME Agency:  NA  HH Arranged:  NA HH Agency:  NA  Status of Service:  In process, will continue to follow  Medicare Important Message Given:    Date Medicare IM Given:    Medicare IM give by:    Date Additional Medicare IM Given:    Additional Medicare Important Message give by:     If discussed at Long Length of Stay Meetings, dates discussed:    Additional Comments:  Golda AcreDavis, Javarious Elsayed Lynn, RN 08/13/2015, 9:46 AM

## 2015-08-13 NOTE — Progress Notes (Signed)
CRITICAL VALUE ALERT  Critical value received:  Lactic acid 2.1  Date of notification:  08/13/15  Time of notification:  1853  Critical value read back:Yes.    Nurse who received alert:  Drue Stageraroline Zyon Rosser  MD notified (1st page):  Janeece FittingXu, F  Time of first page:  1854  MD notified (2nd page):  Time of second page:  Responding MD:  Janeece FittingXu, F  Time MD responded:  [phone]

## 2015-08-14 ENCOUNTER — Inpatient Hospital Stay (HOSPITAL_COMMUNITY): Payer: BLUE CROSS/BLUE SHIELD

## 2015-08-14 DIAGNOSIS — R609 Edema, unspecified: Secondary | ICD-10-CM

## 2015-08-14 DIAGNOSIS — I509 Heart failure, unspecified: Secondary | ICD-10-CM

## 2015-08-14 DIAGNOSIS — E038 Other specified hypothyroidism: Secondary | ICD-10-CM

## 2015-08-14 DIAGNOSIS — E876 Hypokalemia: Secondary | ICD-10-CM

## 2015-08-14 LAB — COMPREHENSIVE METABOLIC PANEL
ALT: 27 U/L (ref 14–54)
ANION GAP: 7 (ref 5–15)
AST: 111 U/L — ABNORMAL HIGH (ref 15–41)
Albumin: 1.7 g/dL — ABNORMAL LOW (ref 3.5–5.0)
Alkaline Phosphatase: 104 U/L (ref 38–126)
BILIRUBIN TOTAL: 6.8 mg/dL — AB (ref 0.3–1.2)
BUN: 10 mg/dL (ref 6–20)
CO2: 18 mmol/L — ABNORMAL LOW (ref 22–32)
Calcium: 7.1 mg/dL — ABNORMAL LOW (ref 8.9–10.3)
Chloride: 110 mmol/L (ref 101–111)
Creatinine, Ser: 0.95 mg/dL (ref 0.44–1.00)
GFR calc Af Amer: >60 mL/min (ref >60)
GFR calc non Af Amer: >60 mL/min (ref >60)
Glucose, Bld: 159 mg/dL — ABNORMAL HIGH (ref 65–99)
POTASSIUM: 3.5 mmol/L (ref 3.5–5.1)
Sodium: 135 mmol/L (ref 135–145)
TOTAL PROTEIN: 4.7 g/dL — AB (ref 6.5–8.1)

## 2015-08-14 LAB — URINE CULTURE: Culture: 1000 — AB

## 2015-08-14 LAB — AMYLASE, PERITONEAL FLUID: AMYLASE, PERITONEAL FLUID: 5 U/L

## 2015-08-14 LAB — CBC
HEMATOCRIT: 26.4 % — AB (ref 36.0–46.0)
Hemoglobin: 9.4 g/dL — ABNORMAL LOW (ref 12.0–15.0)
MCH: 38.2 pg — ABNORMAL HIGH (ref 26.0–34.0)
MCHC: 35.6 g/dL (ref 30.0–36.0)
MCV: 107.3 fL — AB (ref 78.0–100.0)
PLATELETS: 225 10*3/uL (ref 150–400)
RBC: 2.46 MIL/uL — ABNORMAL LOW (ref 3.87–5.11)
RDW: 14.4 % (ref 11.5–15.5)
WBC: 11 10*3/uL — AB (ref 4.0–10.5)

## 2015-08-14 LAB — BASIC METABOLIC PANEL
Anion gap: 9 (ref 5–15)
BUN: 10 mg/dL (ref 6–20)
CALCIUM: 6.7 mg/dL — AB (ref 8.9–10.3)
CHLORIDE: 114 mmol/L — AB (ref 101–111)
CO2: 15 mmol/L — AB (ref 22–32)
CREATININE: 1 mg/dL (ref 0.44–1.00)
GFR calc Af Amer: >60 mL/min (ref >60)
GFR calc non Af Amer: >60 mL/min (ref >60)
GLUCOSE: 154 mg/dL — AB (ref 65–99)
Potassium: 3.2 mmol/L — ABNORMAL LOW (ref 3.5–5.1)
Sodium: 138 mmol/L (ref 135–145)

## 2015-08-14 LAB — PROTIME-INR
INR: 2.38 — ABNORMAL HIGH (ref 0.00–1.49)
PROTHROMBIN TIME: 24.9 s — AB (ref 11.6–15.2)

## 2015-08-14 LAB — MAGNESIUM
MAGNESIUM: 1.8 mg/dL (ref 1.7–2.4)
Magnesium: 2.1 mg/dL (ref 1.7–2.4)

## 2015-08-14 LAB — ECHOCARDIOGRAM COMPLETE
HEIGHTINCHES: 68 in
Weight: 3432.12 oz

## 2015-08-14 LAB — VANCOMYCIN, TROUGH: Vancomycin Tr: 24 ug/mL — ABNORMAL HIGH (ref 10.0–20.0)

## 2015-08-14 LAB — LACTIC ACID, PLASMA: LACTIC ACID, VENOUS: 1.2 mmol/L (ref 0.5–2.0)

## 2015-08-14 MED ORDER — FUROSEMIDE 20 MG PO TABS
20.0000 mg | ORAL_TABLET | Freq: Every day | ORAL | Status: DC
Start: 2015-08-14 — End: 2015-08-19
  Administered 2015-08-14 – 2015-08-19 (×6): 20 mg via ORAL
  Filled 2015-08-14 (×6): qty 1

## 2015-08-14 MED ORDER — POTASSIUM CHLORIDE CRYS ER 20 MEQ PO TBCR
40.0000 meq | EXTENDED_RELEASE_TABLET | Freq: Once | ORAL | Status: AC
  Administered 2015-08-14: 40 meq via ORAL
  Filled 2015-08-14: qty 2

## 2015-08-14 MED ORDER — GUAIFENESIN ER 600 MG PO TB12
600.0000 mg | ORAL_TABLET | Freq: Two times a day (BID) | ORAL | Status: DC
  Administered 2015-08-14 – 2015-08-27 (×26): 600 mg via ORAL
  Filled 2015-08-14 (×27): qty 1

## 2015-08-14 MED ORDER — VANCOMYCIN HCL 10 G IV SOLR
1250.0000 mg | Freq: Two times a day (BID) | INTRAVENOUS | Status: DC
  Administered 2015-08-15: 1250 mg via INTRAVENOUS
  Filled 2015-08-14 (×2): qty 1250

## 2015-08-14 MED ORDER — DEXTROSE 5 % IV SOLN
500.0000 mg | INTRAVENOUS | Status: DC
  Administered 2015-08-14: 500 mg via INTRAVENOUS
  Filled 2015-08-14: qty 500

## 2015-08-14 MED ORDER — MAGNESIUM SULFATE IN D5W 1-5 GM/100ML-% IV SOLN
1.0000 g | Freq: Once | INTRAVENOUS | Status: AC
  Administered 2015-08-14: 1 g via INTRAVENOUS
  Filled 2015-08-14: qty 100

## 2015-08-14 MED ORDER — SODIUM BICARBONATE 650 MG PO TABS
650.0000 mg | ORAL_TABLET | Freq: Two times a day (BID) | ORAL | Status: DC
Start: 2015-08-14 — End: 2015-08-25
  Administered 2015-08-14 – 2015-08-25 (×23): 650 mg via ORAL
  Filled 2015-08-14 (×23): qty 1

## 2015-08-14 MED ORDER — MAGNESIUM SULFATE 2 GM/50ML IV SOLN
2.0000 g | Freq: Once | INTRAVENOUS | Status: AC
  Administered 2015-08-14: 2 g via INTRAVENOUS
  Filled 2015-08-14: qty 50

## 2015-08-14 MED ORDER — SPIRONOLACTONE 25 MG PO TABS
50.0000 mg | ORAL_TABLET | Freq: Every day | ORAL | Status: DC
  Administered 2015-08-14 – 2015-08-15 (×2): 50 mg via ORAL
  Filled 2015-08-14 (×2): qty 2

## 2015-08-14 MED ORDER — POTASSIUM CHLORIDE CRYS ER 20 MEQ PO TBCR
40.0000 meq | EXTENDED_RELEASE_TABLET | ORAL | Status: AC
  Administered 2015-08-14 (×2): 40 meq via ORAL
  Filled 2015-08-14 (×2): qty 2

## 2015-08-14 MED ORDER — PROPRANOLOL HCL 10 MG PO TABS
10.0000 mg | ORAL_TABLET | Freq: Two times a day (BID) | ORAL | Status: DC
  Administered 2015-08-14: 10 mg via ORAL
  Filled 2015-08-14 (×3): qty 1

## 2015-08-14 MED ORDER — DEXTROSE 5 % IV SOLN
1.0000 g | INTRAVENOUS | Status: DC
  Administered 2015-08-14 – 2015-08-15 (×2): 1 g via INTRAVENOUS
  Filled 2015-08-14 (×2): qty 10

## 2015-08-14 NOTE — Progress Notes (Signed)
Echocardiogram 2D Echocardiogram has been performed.  Rhonda Fuentes, Rhonda Fuentes 08/14/2015, 10:44 AM

## 2015-08-14 NOTE — Progress Notes (Signed)
Pharmacy Antibiotic Follow-up Note  Rhonda Fuentes is a 50 y.o. year-old female admitted on 08/12/2015.  The patient is currently on day 3 of antibiotics for sepsis secondary to PNA.   Antibiotics adjusted from Cefepime/Vanc to Azithromycin/Ceftriaxone with the continuation of empiric vancomycin due to MRSA PCR positive result.  Vancomycin trough supratherapeutic at 24 mcg/ml (goal 15-20) on 1g q8h dosing.    Assessment/Plan: Adjust Vancomycin to 1250 mg IV q12h.  RN had already administered a 1g dose of vancomycin after the elevated VT.  Will schedule next vancomycin dose for tomorrow morning to allow for adequate clearance of extra dose. F/u culture results, VT as indicated, and further de-escalation of therapy if able.  Temp (24hrs), Avg:98.2 F (36.8 C), Min:97.7 F (36.5 C), Max:99 F (37.2 C)   Recent Labs Lab 08/12/15 1408 08/14/15 0200  WBC 12.4* 11.0*     Recent Labs Lab 08/12/15 1408 08/12/15 2053 08/13/15 1755 08/14/15 0200 08/14/15 1400  CREATININE 1.01* 0.88 0.92 0.95 1.00   Estimated Creatinine Clearance: 83.6 mL/min (by C-G formula based on Cr of 1).    No Known Allergies  Antimicrobials this admission: 5/4 Zosyn x1 5/4 Vancomycin (8days) >>  5/4 Cefepime (8days) >> 5/6 5/6 Ceftriaxone >> 5/6 Azithromycin >>  Levels/dose changes this admission: 5/5 VT = canceled;       -IV access lost; RN thinks 1/2 dose vanc given.  Giving 500 mg IV x 1 to complete dose.        -Appears VT then drawn between doses -NOT trough, disregard this level 5/6 1530 VT = 24 on 1g q8, adjusting to 1250 mg IV q12h  Microbiology results: 5/4 BCx: NGTD 5/4 MRSA PCR: positive 5/4 Urine: IP 5/5: Peritoneal fluid: IP 5/4: Strep PNeumo: Neg   Thank you for allowing pharmacy to be a part of this patient's care.  Clance BollAmanda Dedric Ethington, PharmD, BCPS Pager: [phone] 08/14/2015 5:35 PM

## 2015-08-14 NOTE — Progress Notes (Signed)
VASCULAR LAB PRELIMINARY  PRELIMINARY  PRELIMINARY  PRELIMINARY  Bilateral lower extremity venous duplex completed.    Preliminary report:  There is no DVT or SVT noted in the bilateral lower extremities.  There is significant interstitial fluid noted throughout the bilateral calves.  Tani Virgo, RVT 08/14/2015, 4:22 PM

## 2015-08-14 NOTE — Progress Notes (Addendum)
PROGRESS NOTE  Rhonda Fuentes ZOX:[id number] DOB: April 25, 1965 DOA: 08/12/2015 PCP: No PCP Per Patient  HPI/Recap of past 24 hours:  Sitting up in chair, having breakfast, no cough, no fever, on room air, denies pain,  reported feeling better, abdomen less distended, bilateral lower extremity remain significantly swollen, slightly confused, not able to provide the name of the hospital, immediate recall 2/3.  Assessment/Plan: Principal Problem:   Sepsis (HCC) Active Problems:   Alcoholic hepatitis with ascites   Pneumonia   Encephalopathy, hepatic (HCC)   Alcoholic cirrhosis (HCC)   Lactic acidosis   Hypothyroidism   GERD (gastroesophageal reflux disease)   HCAP (healthcare-associated pneumonia)  1-Sepsis (HCC): with concerns for HCAP  -patient recently admitted for PNA and hepatic encephalopathy -patient completed treatment according to instructions; but presented still with SOB, tachypnea, elevated WBC's and soft BP. -patient found to have elevated lactic acid and CXR demonstrating new right lower lobe infiltrates. - sepsis protocol, admit to stepdown;  lactic acid trending down, bp stabilized, d/c ivf, continue stress dose steroids, move to med/tele on 5/6 -Vanc and cefepime started from admission, now changed to vanc/rocephin/zithro    2-Alcoholic hepatitis/cirrhosis with ascites: s/p paracentesis, paritoneal fluids analysis:  wbc 72, gram stain no organism seen , no ab pain, less likely sbp. -GI was consulted To assist with treatment/care of her cirrhosis  -low sodium diet ordered, d/c ivf,  start low dose spironolactone/lasix (50:20) on 5/6  3-hepatic encephalopathy: most likely; but possible toxic encephalopathy also in the differential  -patient ammonia 47 on admission -she reported not taking lactulose as prescribed prior to admission and was achieving 1 BM every 2 days -will resume lactulose as initially prescribed and adjust further as needed -might benefit of  rifaximin as well if lactulose failed to do the job alone  4-Hypothyroidism: -TSH 5.144 -on synthroid  5-GERD (gastroesophageal reflux disease): -continue PPI  6-chronic pain syndrome: - decrease/minimize narcotics currently given low BP and encephalopathy  7. INR elevated: likely from cirrhosis and sepsis, hold prophylaxis heparin, repeat inr in am  8. Bilateral lower extremity edema: likely from cirrhosis, will get venous us to r/o DVT  9. Hypokalemia/hypomagnesemia: replace k/mag  10; frequent NSVT: echo pending, keep mag>2 and k>4, started propranolol. Not a candidate for amiodarone due to liver impairment. D/c zithromax. Correct mild acidosis with sodium bicarb tabs. Monitor.   DVT prophylaxis: INR elevated on 5/6, d/c heparin Code Status: Full code Family Communication: no family at bedside  Disposition Plan: to be determined; PT evaluation once medically stable; currently will treat her for sepsis and follow GI rec's as part of treatment for her cirrhosis. Consults called: ED has consulted [facility]   Procedures:  [place] guided paracentesis on 5/4  Antibiotics:  Vanc from admission  cefepime from admission to 5/6  Rocephin/zithro from 5/6   Objective: BP 106/42 mmHg  Pulse 101  Temp(Src) 97.7 F (36.5 C) (Oral)  Resp 33  Ht 5\' 8"  (1.727 m)  Wt 97.3 kg (214 lb 8.1 oz)  BMI 32.62 kg/m2  SpO2 98%  LMP 10/21/2014 (Approximate)  Intake/Output Summary (Last 24 hours) at 08/14/15 0836 Last data filed at 08/14/15 0124  Gross per 24 hour  Intake   1580 ml  Output      0 ml  Net   1580 ml   Filed Weights   08/12/15 1337 08/12/15 1600 08/13/15 0500  Weight: 92.987 kg (205 lb) 94.3 kg (207 lb 14.3 oz) 97.3 kg (214 lb 8.1 oz)  Exam:   General:  fail  Cardiovascular: sinus tachycardia  Respiratory: diminished at basis, no wheezing  Abdomen: distended, but soft, NT, positive BS  Musculoskeletal:diffuse 3+bilateral lower extremity Edema  Neuro:  slight confusion  Data Reviewed: Basic Metabolic Panel:  Recent Labs Lab 08/12/15 1408 08/12/15 2053 08/13/15 1755 08/14/15 0200  NA 137  --  137 135  K 3.5  --  3.2* 3.5  CL 103  --  109 110  CO2 22  --  18* 18*  GLUCOSE 103*  --  128* 159*  BUN 9  --  9 10  CREATININE 1.01* 0.88 0.92 0.95  CALCIUM 8.0*  --  7.3* 7.1*  MG  --  1.4* 1.3* 1.8  PHOS  --  3.6  --   --    Liver Function Tests:  Recent Labs Lab 08/12/15 1408 08/13/15 1755 08/14/15 0200  AST 144* 122* 111*  ALT 38 32 27  ALKPHOS 131* 111 104  BILITOT 8.9* 7.0* 6.8*  PROT 6.0* 4.8* 4.7*  ALBUMIN 2.3* 1.8* 1.7*   No results for input(s): LIPASE, AMYLASE in the last 168 hours.  Recent Labs Lab 08/12/15 1408  AMMONIA 47*   CBC:  Recent Labs Lab 08/12/15 1408 08/14/15 0200  WBC 12.4* 11.0*  NEUTROABS 10.6*  --   HGB 11.0* 9.4*  HCT 31.3* 26.4*  MCV 108.7* 107.3*  PLT 236 225   Cardiac Enzymes:   No results for input(s): CKTOTAL, CKMB, CKMBINDEX, TROPONINI in the last 168 hours. BNP (last 3 results) No results for input(s): BNP in the last 8760 hours.  ProBNP (last 3 results) No results for input(s): PROBNP in the last 8760 hours.  CBG:  Recent Labs Lab 08/12/15 1315  GLUCAP 71    Recent Results (from the past 240 hour(s))  Culture, blood (Routine X 2) w Reflex to ID Panel     Status: None (Preliminary result)   Collection Time: 08/12/15  1:38 PM  Result Value Ref Range Status   Specimen Description BLOOD RIGHT ANTECUBITAL  Final   Special Requests IN PEDIATRIC BOTTLE 5CC  Final   Culture   Final    NO GROWTH < 24 HOURS Performed at [facility]    Report Status PENDING  Incomplete  Culture, blood (Routine X 2) w Reflex to ID Panel     Status: None (Preliminary result)   Collection Time: 08/12/15  2:08 PM  Result Value Ref Range Status   Specimen Description BLOOD LEFT ANTECUBITAL  Final   Special Requests IN PEDIATRIC BOTTLE 2 CC  Final   Culture   Final    NO  GROWTH < 24 HOURS Performed at [facility]    Report Status PENDING  Incomplete  MRSA PCR Screening     Status: Abnormal   Collection Time: 08/12/15  4:45 PM  Result Value Ref Range Status   MRSA by PCR POSITIVE (A) NEGATIVE Final    Comment:        The GeneXpert MRSA Assay (FDA approved for NASAL specimens only), is one component of a comprehensive MRSA colonization surveillance program. It is not intended to diagnose MRSA infection nor to guide or monitor treatment for MRSA infections. RESULT CALLED TO, READ BACK BY AND VERIFIED WITH: CAROLYN CREECH,RN [id number] @ 1805 BY J SCOTTON   Gram stain     Status: None   Collection Time: 08/13/15 10:07 AM  Result Value Ref Range Status   Specimen Description FLUID PERITONEAL  Final  Special Requests NONE  Final   Gram Stain   Final    FEW WBC PRESENT, PREDOMINANTLY MONONUCLEAR NO ORGANISMS SEEN Performed at [facility]    Report Status 08/13/2015 FINAL  Final     Studies: US Paracentesis  08/13/2015  INDICATION: Sepsis, alcoholic hepatitis/ cirrhosis, ascites; request made for diagnostic and therapeutic paracentesis. EXAM: ULTRASOUND GUIDED DIAGNOSTIC AND THERAPEUTIC PARACENTESIS MEDICATIONS: None. COMPLICATIONS: None immediate. PROCEDURE: Informed written consent was obtained from the patient after a discussion of the risks, benefits and alternatives to treatment. A timeout was performed prior to the initiation of the procedure. Initial ultrasound scanning demonstrates a small amount of ascites within the left lower abdominal quadrant. The left lower abdomen was prepped and draped in the usual sterile fashion. 1% lidocaine was used for local anesthesia. Following this, a Yueh catheter was introduced. An ultrasound image was saved for documentation purposes. The paracentesis was performed. The catheter was removed and a dressing was applied. The patient tolerated the procedure well without immediate post procedural  complication. FINDINGS: A total of approximately 1.1 liters of clear, golden yellow fluid was removed. Samples were sent to the laboratory as requested by the clinical team. IMPRESSION: Successful ultrasound-guided diagnostic and therapeutic paracentesis yielding 1.1 liters of peritoneal fluid. Read by: Jeananne Rama, PA-C Electronically Signed   By: Judie Petit.  Shick M.D.   On: 08/13/2015 10:19    Scheduled Meds: . azithromycin  500 mg Intravenous Q24H  . cefTRIAXone (ROCEPHIN)  IV  1 g Intravenous Q24H  . folic acid  1 mg Oral Daily  . hydrocortisone sod succinate (SOLU-CORTEF) inj  50 mg Intravenous Q8H  . lactulose  20 g Oral BID  . levothyroxine  50 mcg Oral QAC breakfast  . magnesium sulfate 1 - 4 g bolus IVPB  1 g Intravenous Once  . pantoprazole  40 mg Oral BID  . potassium chloride  40 mEq Oral Once  . sodium chloride flush  10-40 mL Intracatheter Q12H  . thiamine  100 mg Oral Daily  . vancomycin  1,000 mg Intravenous Q8H    Continuous Infusions:     Time spent:  Treasure Ochs MD, PhD  [facility] Pager [phone]. If 7PM-7AM, please contact night-coverage at [email or web address], password [facility] 08/14/2015, 8:36 AM  LOS: 2 days

## 2015-08-15 ENCOUNTER — Inpatient Hospital Stay (HOSPITAL_COMMUNITY): Payer: BLUE CROSS/BLUE SHIELD

## 2015-08-15 DIAGNOSIS — I472 Ventricular tachycardia: Secondary | ICD-10-CM

## 2015-08-15 LAB — AMMONIA: AMMONIA: 49 umol/L — AB (ref 9–35)

## 2015-08-15 LAB — CBC
HCT: 29.4 % — ABNORMAL LOW (ref 36.0–46.0)
Hemoglobin: 10.4 g/dL — ABNORMAL LOW (ref 12.0–15.0)
MCH: 37.5 pg — ABNORMAL HIGH (ref 26.0–34.0)
MCHC: 35.4 g/dL (ref 30.0–36.0)
MCV: 106.1 fL — AB (ref 78.0–100.0)
PLATELETS: 254 10*3/uL (ref 150–400)
RBC: 2.77 MIL/uL — AB (ref 3.87–5.11)
RDW: 14.4 % (ref 11.5–15.5)
WBC: 17.4 10*3/uL — AB (ref 4.0–10.5)

## 2015-08-15 LAB — BASIC METABOLIC PANEL
ANION GAP: 9 (ref 5–15)
BUN: 13 mg/dL (ref 6–20)
CALCIUM: 7.7 mg/dL — AB (ref 8.9–10.3)
CO2: 17 mmol/L — ABNORMAL LOW (ref 22–32)
Chloride: 110 mmol/L (ref 101–111)
Creatinine, Ser: 1.15 mg/dL — ABNORMAL HIGH (ref 0.44–1.00)
GFR, EST NON AFRICAN AMERICAN: 55 mL/min — AB (ref >60)
GLUCOSE: 164 mg/dL — AB (ref 65–99)
POTASSIUM: 4.3 mmol/L (ref 3.5–5.1)
SODIUM: 136 mmol/L (ref 135–145)

## 2015-08-15 LAB — PROTIME-INR
INR: 2.24 — AB (ref 0.00–1.49)
PROTHROMBIN TIME: 23.9 s — AB (ref 11.6–15.2)

## 2015-08-15 LAB — OCCULT BLOOD X 1 CARD TO LAB, STOOL: FECAL OCCULT BLD: NEGATIVE

## 2015-08-15 LAB — MAGNESIUM: MAGNESIUM: 2.2 mg/dL (ref 1.7–2.4)

## 2015-08-15 LAB — FOLATE: FOLATE: 15.7 ng/mL (ref >5.9)

## 2015-08-15 MED ORDER — LORAZEPAM 2 MG/ML IJ SOLN
0.5000 mg | Freq: Once | INTRAMUSCULAR | Status: AC
  Administered 2015-08-15: 0.5 mg via INTRAVENOUS
  Filled 2015-08-15: qty 1

## 2015-08-15 MED ORDER — LACTULOSE 10 GM/15ML PO SOLN
30.0000 g | Freq: Once | ORAL | Status: AC
  Administered 2015-08-15: 30 g via ORAL
  Filled 2015-08-15: qty 60

## 2015-08-15 MED ORDER — LACTULOSE 10 GM/15ML PO SOLN
30.0000 g | Freq: Two times a day (BID) | ORAL | Status: DC
  Administered 2015-08-15 – 2015-08-27 (×23): 30 g via ORAL
  Filled 2015-08-15 (×25): qty 60

## 2015-08-15 MED ORDER — SPIRONOLACTONE 25 MG PO TABS
25.0000 mg | ORAL_TABLET | Freq: Every day | ORAL | Status: DC
  Administered 2015-08-16 – 2015-08-26 (×11): 25 mg via ORAL
  Filled 2015-08-15 (×12): qty 1

## 2015-08-15 MED ORDER — MUPIROCIN 2 % EX OINT
TOPICAL_OINTMENT | Freq: Two times a day (BID) | CUTANEOUS | Status: AC
Start: 2015-08-15 — End: 2015-08-20
  Administered 2015-08-15 – 2015-08-19 (×9): via NASAL
  Filled 2015-08-15 (×2): qty 22

## 2015-08-15 MED ORDER — CHLORHEXIDINE GLUCONATE CLOTH 2 % EX PADS
6.0000 | MEDICATED_PAD | Freq: Every day | CUTANEOUS | Status: AC
  Administered 2015-08-16 – 2015-08-20 (×5): 6 via TOPICAL

## 2015-08-15 MED ORDER — DOXYCYCLINE HYCLATE 100 MG PO TABS
100.0000 mg | ORAL_TABLET | Freq: Two times a day (BID) | ORAL | Status: DC
  Administered 2015-08-15 – 2015-08-16 (×2): 100 mg via ORAL
  Filled 2015-08-15 (×2): qty 1

## 2015-08-15 MED ORDER — METOPROLOL TARTRATE 25 MG PO TABS
12.5000 mg | ORAL_TABLET | Freq: Two times a day (BID) | ORAL | Status: DC
  Administered 2015-08-15 – 2015-08-26 (×21): 12.5 mg via ORAL
  Filled 2015-08-15 (×24): qty 1

## 2015-08-15 NOTE — Progress Notes (Signed)
Pt is disoriented at times. Removed tele leads multiple times. Refuses at this time to let RN or tech put tele back on. Will continue to monitor.

## 2015-08-15 NOTE — Progress Notes (Addendum)
[facility] Progress Note  Subjective: Feels much better, aware of having been confused and feels more coherent today. Aware of date and  Hospital system. States her peripheral edema is improving  Objective: Vital signs in last 24 hours: Temp:  [97.7 F (36.5 C)-98.2 F (36.8 C)] 98 F (36.7 C) (05/07 0415) Pulse Rate:  [34-99] 90 (05/07 0415) Resp:  [21-35] 22 (05/07 0415) BP: (81-110)/(52-65) 97/58 mmHg (05/07 0415) SpO2:  [80 %-100 %] 97 % (05/07 0415) Weight:  [98.2 kg (216 lb 7.9 oz)-98.7 kg (217 lb 9.5 oz)] 98.2 kg (216 lb 7.9 oz) (05/07 0415) Weight change:    PE: Appears alert oriented appropriate, still with 2-3+ edema bilaterally.  Lab Results: Results for orders placed or performed during the hospital encounter of 08/12/15 (from the past 24 hour(s))  Basic metabolic panel     Status: Abnormal   Collection Time: 08/14/15  2:00 PM  Result Value Ref Range   Sodium 138 135 - 145 mmol/L   Potassium 3.2 (L) 3.5 - 5.1 mmol/L   Chloride 114 (H) 101 - 111 mmol/L   CO2 15 (L) 22 - 32 mmol/L   Glucose, Bld 154 (H) 65 - 99 mg/dL   BUN 10 6 - 20 mg/dL   Creatinine, Ser 1.611.00 0.44 - 1.00 mg/dL   Calcium 6.7 (L) 8.9 - 10.3 mg/dL   GFR calc non Af Amer >60 >60 mL/min   GFR calc Af Amer >60 >60 mL/min   Anion gap 9 5 - 15  Magnesium     Status: None   Collection Time: 08/14/15  2:00 PM  Result Value Ref Range   Magnesium 2.1 1.7 - 2.4 mg/dL  Vancomycin, trough     Status: Abnormal   Collection Time: 08/14/15  3:35 PM  Result Value Ref Range   Vancomycin Tr 24 (H) 10.0 - 20.0 ug/mL  CBC     Status: Abnormal   Collection Time: 08/15/15  3:10 AM  Result Value Ref Range   WBC 17.4 (H) 4.0 - 10.5 K/uL   RBC 2.77 (L) 3.87 - 5.11 MIL/uL   Hemoglobin 10.4 (L) 12.0 - 15.0 g/dL   HCT 09.629.4 (L) 04.536.0 - 40.946.0 %   MCV 106.1 (H) 78.0 - 100.0 fL   MCH 37.5 (H) 26.0 - 34.0 pg   MCHC 35.4 30.0 - 36.0 g/dL   RDW 81.114.4 91.411.5 - 78.215.5 %   Platelets 254 150 - 400 K/uL  Basic  metabolic panel     Status: Abnormal   Collection Time: 08/15/15  3:10 AM  Result Value Ref Range   Sodium 136 135 - 145 mmol/L   Potassium 4.3 3.5 - 5.1 mmol/L   Chloride 110 101 - 111 mmol/L   CO2 17 (L) 22 - 32 mmol/L   Glucose, Bld 164 (H) 65 - 99 mg/dL   BUN 13 6 - 20 mg/dL   Creatinine, Ser 9.561.15 (H) 0.44 - 1.00 mg/dL   Calcium 7.7 (L) 8.9 - 10.3 mg/dL   GFR calc non Af Amer 55 (L) >60 mL/min   GFR calc Af Amer >60 >60 mL/min   Anion gap 9 5 - 15  Magnesium     Status: None   Collection Time: 08/15/15  3:10 AM  Result Value Ref Range   Magnesium 2.2 1.7 - 2.4 mg/dL  Protime-INR     Status: Abnormal   Collection Time: 08/15/15  3:10 AM  Result Value Ref Range   Prothrombin Time 23.9 (H) 11.6 -  15.2 seconds   INR 2.24 (H) 0.00 - 1.49  Ammonia     Status: Abnormal   Collection Time: 08/15/15  3:10 AM  Result Value Ref Range   Ammonia 49 (H) 9 - 35 umol/L    Studies/Results: US Paracentesis  08/13/2015  INDICATION: Sepsis, alcoholic hepatitis/ cirrhosis, ascites; request made for diagnostic and therapeutic paracentesis. EXAM: ULTRASOUND GUIDED DIAGNOSTIC AND THERAPEUTIC PARACENTESIS MEDICATIONS: None. COMPLICATIONS: None immediate. PROCEDURE: Informed written consent was obtained from the patient after a discussion of the risks, benefits and alternatives to treatment. A timeout was performed prior to the initiation of the procedure. Initial ultrasound scanning demonstrates a small amount of ascites within the left lower abdominal quadrant. The left lower abdomen was prepped and draped in the usual sterile fashion. 1% lidocaine was used for local anesthesia. Following this, a Yueh catheter was introduced. An ultrasound image was saved for documentation purposes. The paracentesis was performed. The catheter was removed and a dressing was applied. The patient tolerated the procedure well without immediate post procedural complication. FINDINGS: A total of approximately 1.1 liters of  clear, golden yellow fluid was removed. Samples were sent to the laboratory as requested by the clinical team. IMPRESSION: Successful ultrasound-guided diagnostic and therapeutic paracentesis yielding 1.1 liters of peritoneal fluid. Read by: Jeananne Rama, PA-C Electronically Signed   By: Judie Petit.  Shick M.D.   On: 08/13/2015 10:19      Assessment: Alcoholic hepatitis with ascites, no SBP by paracentesis Encephalopathy, improving Peripheral edema  Plan: Agree with spironolactone and Lasix and probably could increase to 100/40 mg respectively Titrate lactulose to 3 or 4 loose bowel movements a day Advised abstinence which she states motivation to do. Hopefully home soon.     Ravleen Ries C 08/15/2015, 8:24 AM  Pager [phone] If no answer or after 5 PM call [phone]

## 2015-08-15 NOTE — Progress Notes (Signed)
Patient have had bowel movements but always mix up with urine, unable to get stool sample.

## 2015-08-15 NOTE — Evaluation (Signed)
Physical Therapy Evaluation Patient Details Name: Rhonda Fuentes MRN: [id number] DOB: 12/29/1965 Today's Date: 08/15/2015   History of Present Illness  50 y.o. female with h/o liver cirrhosis, hypothyroid, GERD, chronic pain admitted with jaundice, confusion, hypotension, SOB. Dx of alcoholic hepatitis, PNA, encephalopathy, s/p paracentesis 08/13/15.  Clinical Impression  Pt is independent with mobility, she ambulated 200' without an assistive device, no loss of balance, HR 104 walking. From PT standpoint she is safe to DC home, no follow up PT nor DME needed. PT signing off.     Follow Up Recommendations No PT follow up    Equipment Recommendations  None recommended by PT    Recommendations for Other Services       Precautions / Restrictions Precautions Precautions: Fall Precaution Comments: pt reports 1 fall in past 1 year (tripped last summer) Restrictions Weight Bearing Restrictions: No      Mobility  Bed Mobility Overal bed mobility: Independent                Transfers Overall transfer level: Independent Equipment used: None             General transfer comment: Safe and steady with transfers without UE use  Ambulation/Gait Ambulation/Gait assistance: Independent Ambulation Distance (Feet): 200 Feet Assistive device: None Gait Pattern/deviations: Wide base of support;Step-through pattern   Gait velocity interpretation: at or above normal speed for age/gender General Gait Details: HR 104 with walking, no pain with ambulation, no loss of balance  Stairs            Wheelchair Mobility    Modified Rankin (Stroke Patients Only)       Balance Overall balance assessment: No apparent balance deficits (not formally assessed)                                           Pertinent Vitals/Pain Pain Assessment: No/denies pain    Home Living Family/patient expects to be discharged to:: Private residence Living Arrangements:  Spouse/significant other Available Help at Discharge: Family Type of Home: Apartment Home Access: Stairs to enter Entrance Stairs-Rails: Can reach both Entrance Stairs-Number of Steps: 2 flights Home Layout: One level Home Equipment: Shower seat - built in (No assistive devices)      Prior Function Level of Independence: Independent         Comments: Drives and independent with ADLs/IADLs     Hand Dominance   Dominant Hand: Right    Extremity/Trunk Assessment   Upper Extremity Assessment: Overall WFL for tasks assessed           Lower Extremity Assessment: Overall WFL for tasks assessed (pitting edema BLEs, sensation intact to light touch)      Cervical / Trunk Assessment: Normal  Communication   Communication: No difficulties  Cognition Arousal/Alertness: Awake/alert Behavior During Therapy: WFL for tasks assessed/performed Overall Cognitive Status: Within Functional Limits for tasks assessed                      General Comments      Exercises        Assessment/Plan    PT Assessment Patent does not need any further PT services  PT Diagnosis     PT Problem List    PT Treatment Interventions     PT Goals (Current goals can be found in the Care Plan section) Acute Rehab PT Goals PT  Goal Formulation: All assessment and education complete, DC therapy    Frequency     Barriers to discharge        Co-evaluation               End of Session Equipment Utilized During Treatment: Gait belt Activity Tolerance: Patient tolerated treatment well Patient left: in chair;with call bell/phone within reach Nurse Communication: Mobility status         Time: 1000-1020 PT Time Calculation (min) (ACUTE ONLY): 20 min   Charges:   PT Evaluation $PT Eval Low Complexity: 1 Procedure     PT G Codes:        Tamala SerUhlenberg, Millie Forde Kistler 08/15/2015, 10:33 AM [phone]

## 2015-08-15 NOTE — Consult Note (Signed)
Primary cardiologist: N/A Consulting cardiologist: Dr Dina Rich Requesting Physician: Dr Albertine Grates Indication for consult: NSVT  Clinical Summary Rhonda Fuentes is a 50 y.o.female with history of EtOH cirrhosis, hypothyroidism, chronic pain syndrome admitted with sepsis secondary to HCAP. Cardiology is consulted for NSVT. She has no known cardiac history.Denies any chest pain, no palpitations, no presyncope or syncope.    Mg 2.2, K 4.3, Cr 1.15, Hgb 10.4, WBC 17.4, Plt 254 CXR +pneumonia EKG SR, LAE, incomplete RBBB Echo 08/2015: LVEF 55-60%, mod RV dilatation     No Known Allergies  Medications Scheduled Medications: . cefTRIAXone (ROCEPHIN)  IV  1 g Intravenous Q24H  . folic acid  1 mg Oral Daily  . furosemide  20 mg Oral Daily  . guaiFENesin  600 mg Oral BID  . hydrocortisone sod succinate (SOLU-CORTEF) inj  50 mg Intravenous Q8H  . lactulose  20 g Oral BID  . levothyroxine  50 mcg Oral QAC breakfast  . pantoprazole  40 mg Oral BID  . propranolol  10 mg Oral BID  . sodium bicarbonate  650 mg Oral BID  . sodium chloride flush  10-40 mL Intracatheter Q12H  . spironolactone  50 mg Oral Daily  . thiamine  100 mg Oral Daily  . vancomycin  1,250 mg Intravenous Q12H     Infusions:     PRN Medications:  ondansetron **OR** ondansetron (ZOFRAN) IV, oxyCODONE, sodium chloride flush, zolpidem   Past Medical History  Diagnosis Date  . Anemia   . Thyroid disease   . Alcohol abuse   . 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 Cholecystectomy    . Gastric bypass    . Ankle surgery      Family History  Problem Relation Age of Onset  . Breast cancer Sister     Social History Ms. Overholser reports that she has never smoked. She has never used smokeless tobacco. Ms. Enlow reports that she drinks about 12.0 oz of alcohol per week.  Review of Systems CONSTITUTIONAL: No weight loss, fever, chills, weakness or  fatigue.  HEENT: Eyes: No visual loss, blurred vision, double vision or yellow sclerae. No hearing loss, sneezing, congestion, runny nose or sore throat.  SKIN: No rash or itching.  CARDIOVASCULAR: No chest pain, chest pressure or chest discomfort. No palpitations or edema.  RESPIRATORY: No shortness of breath, cough or sputum.  GASTROINTESTINAL: No anorexia, nausea, vomiting or diarrhea. No abdominal pain or blood.  GENITOURINARY: no polyuria, no dysuria NEUROLOGICAL: No headache, dizziness, syncope, paralysis, ataxia, numbness or tingling in the extremities. No change in bowel or bladder control.  MUSCULOSKELETAL: No muscle, back pain, joint pain or stiffness.  HEMATOLOGIC: No anemia, bleeding or bruising.  LYMPHATICS: No enlarged nodes. No history of splenectomy.  PSYCHIATRIC: No history of depression or anxiety.      Physical Examination Blood pressure 97/58, pulse 90, temperature 98 F (36.7 C), temperature source Oral, resp. rate 22, height  (1.727 m), weight 216 lb 7.9 oz (98.2 kg), last menstrual period 10/21/2014, SpO2 97 %.  Intake/Output Summary (Last 24 hours) at 08/15/15 0945 Last data filed at 08/15/15 0754  Gross per 24 hour  Intake    490 ml  Output    150 ml  Net    340 ml    HEENT: sclera clear, throat clear  Cardiovascular: RRR, no m/r/g, no jvd  Respiratory:C TAB  GI: abdomen soft, NT,  ND  MSK: 2+ bilateral LE edema  Neuro: no focal deficits  Psych: appropriate affect  Lab Results  Basic Metabolic Panel:  Recent Labs Lab 08/12/15 1408 08/12/15 2053 08/13/15 1755 08/14/15 0200 08/14/15 1400 08/15/15 0310  NA 137  --  137 135 138 136  K 3.5  --  3.2* 3.5 3.2* 4.3  CL 103  --  109 110 114* 110  CO2 22  --  18* 18* 15* 17*  GLUCOSE 103*  --  128* 159* 154* 164*  BUN 9  --  9 10 10 13   CREATININE 1.01* 0.88 0.92 0.95 1.00 1.15*  CALCIUM 8.0*  --  7.3* 7.1* 6.7* 7.7*  MG  --  1.4* 1.3* 1.8 2.1 2.2  PHOS  --  3.6  --   --   --   --      Liver Function Tests:  Recent Labs Lab 08/12/15 1408 08/13/15 1755 08/14/15 0200  AST 144* 122* 111*  ALT 38 32 27  ALKPHOS 131* 111 104  BILITOT 8.9* 7.0* 6.8*  PROT 6.0* 4.8* 4.7*  ALBUMIN 2.3* 1.8* 1.7*    CBC:  Recent Labs Lab 08/12/15 1408 08/14/15 0200 08/15/15 0310  WBC 12.4* 11.0* 17.4*  NEUTROABS 10.6*  --   --   HGB 11.0* 9.4* 10.4*  HCT 31.3* 26.4* 29.4*  MCV 108.7* 107.3* 106.1*  PLT 236 225 254    Cardiac Enzymes: No results for input(s): CKTOTAL, CKMB, CKMBINDEX, TROPONINI in the last 168 hours.  BNP: Invalid input(s): POCBNP     Impression/Recommendations 1. NSVT - telemetry reviewed, episodic PVCs and runs of NSVT the longest noted 11 beats. Asymptomatic. - K and Mg are at goal. TSH elevated indicating no hyperthyroidism. Echo with normal LVEF and no WMAs, mention or some RV enlargement but with normal function, no PASP reported. Potentially hepatopulmonary disease.  - episodes of ventricular ectopy in setting of pneumonia and sepsis, increased cateholmine surge in setting of acute illness. Started on propanolol, unless strong indication for varices (which is not reported in history, had not been on at home) would recommend changing to more cardiac specific beta blocker lopressor. Soft bp's at times, I would decrease her aldactone to 25mg  daily to see if can tolerate beta blocker better.  - if PVC burden continues or progresses may consider ischemic testing once acute medical issues improved. Date thus far does not support structural heart disease. Suspect will lesses as infection resolves and on beta blocker.     Dina RichJonathan Branch, M.D.

## 2015-08-15 NOTE — Progress Notes (Addendum)
PROGRESS NOTE  Rhonda Fuentes WUJ:[id number] DOB: 09/27/1965 DOA: 08/12/2015 PCP: No PCP Per Patient  HPI/Recap of past 24 hours:   remain slightly confused,  no cough, no fever, on room air, denies pain,   abdomen less distended, bilateral lower extremity remain significantly swollen,   Assessment/Plan: Principal Problem:   Sepsis (HCC) Active Problems:   Alcoholic hepatitis with ascites   Pneumonia   Encephalopathy, hepatic (HCC)   Alcoholic cirrhosis (HCC)   Lactic acidosis   Hypothyroidism   GERD (gastroesophageal reflux disease)   HCAP (healthcare-associated pneumonia)  1-Sepsis (HCC): with concerns for HCAP  -patient recently admitted for PNA and hepatic encephalopathy -patient completed treatment according to instructions; but presented still with SOB, tachypnea, elevated WBC's and soft BP. -patient found to have elevated lactic acid and CXR demonstrating new right lower lobe infiltrates. - sepsis protocol, admitted to stepdown;  lactic acid normalized, bp remain borderline low, d/c ivf, continue stress dose steroids, moved to med/tele on 5/6 -Vanc and cefepime started from admission, now changed to vanc/rocephin/zithro, dropped zithro due to frequent NSVT. Now d/c ivf abx, change to oral doxycycline on 5/7, repeat cxr two view.    2-Alcoholic hepatitis/cirrhosis with ascites: s/p paracentesis, paritoneal fluids analysis:  wbc 72, gram stain no organism seen , no ab pain, less likely sbp. -GI was consulted To assist with treatment/care of her cirrhosis  -low sodium diet ordered, d/c ivf,  start low dose spironolactone/lasix (50:20) on 5/6  3-hepatic encephalopathy: most likely; but possible toxic encephalopathy also in the differential  -patient ammonia 47 on admission -she reported not taking lactulose as prescribed prior to admission and was achieving 1 BM every 2 days -will resume lactulose as initially prescribed and adjust further as needed -might benefit of  rifaximin as well if lactulose failed to do the job alone  4-Hypothyroidism: -TSH 5.144 -on synthroid  5-GERD (gastroesophageal reflux disease): -continue PPI  6-chronic pain syndrome: - decrease/minimize narcotics currently given low BP and encephalopathy  7. INR elevated: likely from cirrhosis and sepsis, hold prophylaxis heparin, monitor INR  8. Bilateral lower extremity edema: likely from cirrhosis, venous US no DVT  9. Hypokalemia/hypomagnesemia: replace k/mag  10; frequent NSVT: echo adequate EF, no wall motion abnromalities, keep mag>2 and k>4, started betablocker. Not a candidate for amiodarone due to liver impairment. D/c zithromax. Correct mild acidosis with sodium bicarb tabs. Cardiology consulted.  12: cr elevated on 5/7, from vanc? Slightly supratherapeutic, from diuretics? From hypotension? Monitor. 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  DVT prophylaxis: INR elevated on 5/6, d/c heparin Code Status: Full code Family Communication: no family at bedside  Disposition Plan: likely home in 1-2 days Consults called:  ED has consulted [facility] for NSVT   Procedures:  US guided paracentesis on 5/4  Antibiotics:  Vanc from admission to 5/7  cefepime from admission to 5/6 t  Rocephin/zithro from 5/6 to 5/7  Doxycycline from 5/7   Objective: BP 88/55 mmHg  Pulse 81  Temp(Src) 98 F (36.7 C) (Oral)  Resp 22  Ht  (1.727 m)  Wt 98.2 kg (216 lb 7.9 oz)  BMI 32.93 kg/m2  SpO2 97%  LMP 10/21/2014 (Approximate)  Intake/Output Summary (Last 24 hours) at 08/15/15 1431 Last data filed at 08/15/15 1320  Gross per 24 hour  Intake    610 ml  Output    350 ml  Net    260 ml   Filed Weights   08/13/15 0500  08/14/15 1120 08/15/15 0415  Weight: 97.3 kg (214 lb 8.1 oz) 98.7 kg (217 lb 9.5 oz) 98.2 kg (216 lb 7.9 oz)    Exam:   General:  Fail, slightly confused  Cardiovascular: sinus tachycardia has  resolved, NSVT  Respiratory: improved aeration, no wheezing  Abdomen: less distended, but soft, NT, positive BS  Musculoskeletal:diffuse 3+bilateral lower extremity Edema  Neuro: slight confusion  Data Reviewed: Basic Metabolic Panel:  Recent Labs Lab 08/12/15 1408 08/12/15 2053 08/13/15 1755 08/14/15 0200 08/14/15 1400 08/15/15 0310  NA 137  --  137 135 138 136  K 3.5  --  3.2* 3.5 3.2* 4.3  CL 103  --  109 110 114* 110  CO2 22  --  18* 18* 15* 17*  GLUCOSE 103*  --  128* 159* 154* 164*  BUN 9  --  9 10 10 13   CREATININE 1.01* 0.88 0.92 0.95 1.00 1.15*  CALCIUM 8.0*  --  7.3* 7.1* 6.7* 7.7*  MG  --  1.4* 1.3* 1.8 2.1 2.2  PHOS  --  3.6  --   --   --   --    Liver Function Tests:  Recent Labs Lab 08/12/15 1408 08/13/15 1755 08/14/15 0200  AST 144* 122* 111*  ALT 38 32 27  ALKPHOS 131* 111 104  BILITOT 8.9* 7.0* 6.8*  PROT 6.0* 4.8* 4.7*  ALBUMIN 2.3* 1.8* 1.7*   No results for input(s): LIPASE, AMYLASE in the last 168 hours.  Recent Labs Lab 08/12/15 1408 08/15/15 0310  AMMONIA 47* 49*   CBC:  Recent Labs Lab 08/12/15 1408 08/14/15 0200 08/15/15 0310  WBC 12.4* 11.0* 17.4*  NEUTROABS 10.6*  --   --   HGB 11.0* 9.4* 10.4*  HCT 31.3* 26.4* 29.4*  MCV 108.7* 107.3* 106.1*  PLT 236 225 254   Cardiac Enzymes:   No results for input(s): CKTOTAL, CKMB, CKMBINDEX, TROPONINI in the last 168 hours. BNP (last 3 results) No results for input(s): BNP in the last 8760 hours.  ProBNP (last 3 results) No results for input(s): PROBNP in the last 8760 hours.  CBG:  Recent Labs Lab 08/12/15 1315  GLUCAP 71    Recent Results (from the past 240 hour(s))  Culture, blood (Routine X 2) w Reflex to ID Panel     Status: None (Preliminary result)   Collection Time: 08/12/15  1:38 PM  Result Value Ref Range Status   Specimen Description BLOOD RIGHT ANTECUBITAL  Final   Special Requests IN PEDIATRIC BOTTLE 5CC  Final   Culture   Final    NO GROWTH 2  DAYS [facility]    Report Status PENDING  Incomplete  Culture, blood (Routine X 2) w Reflex to ID Panel     Status: None (Preliminary result)   Collection Time: 08/12/15  2:08 PM  Result Value Ref Range Status   Specimen Description BLOOD LEFT ANTECUBITAL  Final   Special Requests IN PEDIATRIC BOTTLE 2 CC  Final   Culture   Final    NO GROWTH 2 DAYS Performed at [facility]    Report Status PENDING  Incomplete  MRSA PCR Screening     Status: Abnormal   Collection Time: 08/12/15  4:45 PM  Result Value Ref Range Status   MRSA by PCR POSITIVE (A) NEGATIVE Final    Comment:        The GeneXpert MRSA Assay (FDA approved for NASAL specimens only), is one component of a comprehensive  MRSA colonization surveillance program. It is not intended to diagnose MRSA infection nor to guide or monitor treatment for MRSA infections. RESULT CALLED TO, READ BACK BY AND VERIFIED WITH: CAROLYN CREECH,RN [id number] @ 1805 BY J SCOTTON   Urine culture     Status: Abnormal   Collection Time: 08/12/15  9:23 PM  Result Value Ref Range Status   Specimen Description URINE, CLEAN CATCH  Final   Special Requests NONE  Final   Culture (A)  Final    1,000 COLONIES/mL INSIGNIFICANT GROWTH Performed at [facility]    Report Status 08/14/2015 FINAL  Final  Culture, body fluid-bottle     Status: None (Preliminary result)   Collection Time: 08/13/15 10:07 AM  Result Value Ref Range Status   Specimen Description FLUID PERITONEAL  Final   Special Requests BOTTLES DRAWN AEROBIC AND ANAEROBIC 10CC  Final   Culture   Final    NO GROWTH < 24 HOURS Performed at [facility]    Report Status PENDING  Incomplete  Gram stain     Status: None   Collection Time: 08/13/15 10:07 AM  Result Value Ref Range Status   Specimen Description FLUID PERITONEAL  Final   Special Requests NONE  Final   Gram Stain   Final    FEW WBC PRESENT, PREDOMINANTLY MONONUCLEAR NO ORGANISMS  SEEN Performed at [facility]    Report Status 08/13/2015 FINAL  Final     Studies: No results found.  Scheduled Meds: . cefTRIAXone (ROCEPHIN)  IV  1 g Intravenous Q24H  . folic acid  1 mg Oral Daily  . furosemide  20 mg Oral Daily  . guaiFENesin  600 mg Oral BID  . hydrocortisone sod succinate (SOLU-CORTEF) inj  50 mg Intravenous Q8H  . lactulose  20 g Oral BID  . levothyroxine  50 mcg Oral QAC breakfast  . metoprolol tartrate  12.5 mg Oral BID  . pantoprazole  40 mg Oral BID  . sodium bicarbonate  650 mg Oral BID  . sodium chloride flush  10-40 mL Intracatheter Q12H  . [START ON 08/16/2015] spironolactone  25 mg Oral Daily  . thiamine  100 mg Oral Daily  . vancomycin  1,250 mg Intravenous Q12H    Continuous Infusions:     Time spent:  Shaira Sova MD, PhD  [facility] Pager [phone]. If 7PM-7AM, please contact night-coverage at [email or web address], password [facility] 08/15/2015, 2:31 PM  LOS: 3 days

## 2015-08-16 ENCOUNTER — Inpatient Hospital Stay (HOSPITAL_COMMUNITY): Payer: BLUE CROSS/BLUE SHIELD

## 2015-08-16 DIAGNOSIS — I493 Ventricular premature depolarization: Secondary | ICD-10-CM

## 2015-08-16 LAB — COMPREHENSIVE METABOLIC PANEL
ALBUMIN: 2 g/dL — AB (ref 3.5–5.0)
ALT: 34 U/L (ref 14–54)
AST: 113 U/L — AB (ref 15–41)
Alkaline Phosphatase: 115 U/L (ref 38–126)
Anion gap: 9 (ref 5–15)
BUN: 17 mg/dL (ref 6–20)
CHLORIDE: 111 mmol/L (ref 101–111)
CO2: 17 mmol/L — AB (ref 22–32)
CREATININE: 1.27 mg/dL — AB (ref 0.44–1.00)
Calcium: 8 mg/dL — ABNORMAL LOW (ref 8.9–10.3)
GFR calc non Af Amer: 49 mL/min — ABNORMAL LOW (ref >60)
GFR, EST AFRICAN AMERICAN: 56 mL/min — AB (ref >60)
Glucose, Bld: 135 mg/dL — ABNORMAL HIGH (ref 65–99)
Potassium: 4.1 mmol/L (ref 3.5–5.1)
SODIUM: 137 mmol/L (ref 135–145)
TOTAL PROTEIN: 5.3 g/dL — AB (ref 6.5–8.1)
Total Bilirubin: 6.1 mg/dL — ABNORMAL HIGH (ref 0.3–1.2)

## 2015-08-16 LAB — VITAMIN B12: VITAMIN B 12: 583 pg/mL (ref 180–914)

## 2015-08-16 LAB — PROTIME-INR
INR: 2 — ABNORMAL HIGH (ref 0.00–1.49)
PROTHROMBIN TIME: 21.9 s — AB (ref 11.6–15.2)

## 2015-08-16 LAB — CBC
HCT: 29.5 % — ABNORMAL LOW (ref 36.0–46.0)
Hemoglobin: 10.2 g/dL — ABNORMAL LOW (ref 12.0–15.0)
MCH: 36.4 pg — AB (ref 26.0–34.0)
MCHC: 34.6 g/dL (ref 30.0–36.0)
MCV: 105.4 fL — AB (ref 78.0–100.0)
PLATELETS: 172 10*3/uL (ref 150–400)
RBC: 2.8 MIL/uL — AB (ref 3.87–5.11)
RDW: 14.2 % (ref 11.5–15.5)
WBC: 18.3 10*3/uL — AB (ref 4.0–10.5)

## 2015-08-16 LAB — MAGNESIUM: MAGNESIUM: 2.2 mg/dL (ref 1.7–2.4)

## 2015-08-16 LAB — AMMONIA: AMMONIA: 40 umol/L — AB (ref 9–35)

## 2015-08-16 MED ORDER — VANCOMYCIN HCL 10 G IV SOLR
1250.0000 mg | Freq: Two times a day (BID) | INTRAVENOUS | Status: DC
  Administered 2015-08-16: 1250 mg via INTRAVENOUS
  Filled 2015-08-16 (×3): qty 1250

## 2015-08-16 MED ORDER — HYDROCORTISONE NA SUCCINATE PF 100 MG IJ SOLR
50.0000 mg | Freq: Two times a day (BID) | INTRAMUSCULAR | Status: DC
  Administered 2015-08-16: 50 mg via INTRAVENOUS
  Filled 2015-08-16: qty 2

## 2015-08-16 MED ORDER — DEXTROSE 5 % IV SOLN
1.0000 g | Freq: Three times a day (TID) | INTRAVENOUS | Status: DC
  Administered 2015-08-16 (×2): 1 g via INTRAVENOUS
  Filled 2015-08-16 (×5): qty 1

## 2015-08-16 NOTE — Progress Notes (Signed)
[facility] Progress Note  Subjective: The patient states that she is feeling much better today.  She is on lactulose for hepatic encephalopathy  She is on a combination of Aldactone and Lasix for her ascites and peripheral edema  Objective: Vital signs in last 24 hours: Temp:  [97.5 F (36.4 C)-98 F (36.7 C)] 97.5 F (36.4 C) (05/08 0423) Pulse Rate:  [81-97] 83 (05/08 0423) Resp:  [18-22] 18 (05/08 0423) BP: (88-100)/(52-81) 91/60 mmHg (05/08 0423) SpO2:  [97 %-98 %] 97 % (05/08 0423) Weight:  [98.1 kg (216 lb 4.3 oz)] 98.1 kg (216 lb 4.3 oz) (05/08 0619) Weight change: -0.6 kg (-1 lb 5.2 oz)   PE:  No distress  Heart regular rhythm no murmurs  Lungs clear  Abdomen: Bowel sounds present, soft, ascites, nontender  Lab Results: Results for orders placed or performed during the hospital encounter of 08/12/15 (from the past 24 hour(s))  Occult blood card to lab, stool     Status: None   Collection Time: 08/15/15  6:58 PM  Result Value Ref Range   Fecal Occult Bld NEGATIVE NEGATIVE  Protime-INR     Status: Abnormal   Collection Time: 08/16/15  3:36 AM  Result Value Ref Range   Prothrombin Time 21.9 (H) 11.6 - 15.2 seconds   INR 2.00 (H) 0.00 - 1.49  CBC     Status: Abnormal   Collection Time: 08/16/15  3:36 AM  Result Value Ref Range   WBC 18.3 (H) 4.0 - 10.5 K/uL   RBC 2.80 (L) 3.87 - 5.11 MIL/uL   Hemoglobin 10.2 (L) 12.0 - 15.0 g/dL   HCT 82.9 (L) 56.2 - 13.0 %   MCV 105.4 (H) 78.0 - 100.0 fL   MCH 36.4 (H) 26.0 - 34.0 pg   MCHC 34.6 30.0 - 36.0 g/dL   RDW 86.5 78.4 - 69.6 %   Platelets 172 150 - 400 K/uL  Comprehensive metabolic panel     Status: Abnormal   Collection Time: 08/16/15  3:36 AM  Result Value Ref Range   Sodium 137 135 - 145 mmol/L   Potassium 4.1 3.5 - 5.1 mmol/L   Chloride 111 101 - 111 mmol/L   CO2 17 (L) 22 - 32 mmol/L   Glucose, Bld 135 (H) 65 - 99 mg/dL   BUN 17 6 - 20 mg/dL   Creatinine, Ser 2.95 (H) 0.44 - 1.00 mg/dL    Calcium 8.0 (L) 8.9 - 10.3 mg/dL   Total Protein 5.3 (L) 6.5 - 8.1 g/dL   Albumin 2.0 (L) 3.5 - 5.0 g/dL   AST 284 (H) 15 - 41 U/L   ALT 34 14 - 54 U/L   Alkaline Phosphatase 115 38 - 126 U/L   Total Bilirubin 6.1 (H) 0.3 - 1.2 mg/dL   GFR calc non Af Amer 49 (L) >60 mL/min   GFR calc Af Amer 56 (L) >60 mL/min   Anion gap 9 5 - 15  Magnesium     Status: None   Collection Time: 08/16/15  3:36 AM  Result Value Ref Range   Magnesium 2.2 1.7 - 2.4 mg/dL  Ammonia     Status: Abnormal   Collection Time: 08/16/15  3:36 AM  Result Value Ref Range   Ammonia 40 (H) 9 - 35 umol/L    Studies/Results: Dg Chest 2 View  08/15/2015  CLINICAL DATA:  50 year old female with a history of pneumonia EXAM: CHEST - 2 VIEW COMPARISON:  08/12/2015 FINDINGS: Cardiomediastinal silhouette projects within  normal limits in size and contour. Lung volumes remain low. Airspace opacity of the right upper lobe, abutting the minor fissure. Similar appearance of perihilar opacities and left sided linear opacities. No pleural effusion. No pneumothorax. Interval placement of right upper extremity PICC, with the tip appearing to terminate at the superior cavoatrial junction. No displaced fracture. Unremarkable appearance of the upper abdomen. IMPRESSION: Similar appearance of bilateral opacities, involving predominantly the right upper lobe, compatible with pneumonia. Interval placement of right upper extremity PICC Signed, Yvone NeuJaime S. Loreta AveWagner, DO [facility] Electronically Signed   By: Gilmer MorJaime  Wagner D.O.   On: 08/15/2015 19:03      Assessment: Alcoholic cirrhosis of the liver with portal hypertension and ascites and hepatic encephalopathy  Plan:   Continue current management plan. Follow clinically.    SAM F Mylissa Lambe 08/16/2015, 10:01 AM  Pager: [phone] If no answer or after 5 PM call [phone]

## 2015-08-16 NOTE — Progress Notes (Signed)
Pharmacy Antibiotic Note  Rhonda Fuentes is a 50 y.o. female admitted on 08/12/2015 with PNA and hepatic encephalopathy.  Vanc and cefepime started from admission,, changed to oral doxycycline on 5/7, repeat CT chest with persistent multifocal pna, will broaden abx to vanc/ cefepime.   Pharmacy has been consulted for  dosing.  Plan: -From level done on 5/6 start Vancomycin 1250mg  IV q12h -Cefepime 1gm IV q8h -Follow renal function, VT as indicated, further de-escalation of therapy if able.  Height: 5\' 8"  (172.7 cm) Weight: 216 lb 4.3 oz (98.1 kg) IBW/kg (Calculated) : 63.9  Temp (24hrs), Avg:97.9 F (36.6 C), Min:97.5 F (36.4 C), Max:98.2 F (36.8 C)   Recent Labs Lab 08/12/15 1408  08/12/15 1804  08/12/15 2054 08/13/15 1755 08/13/15 1930 08/13/15 1952 08/14/15 0120 08/14/15 0200 08/14/15 1400 08/14/15 1535 08/15/15 0310 08/16/15 0336  WBC 12.4*  --   --   --   --   --   --   --   --  11.0*  --   --  17.4* 18.3*  CREATININE 1.01*  --   --   < >  --  0.92  --   --   --  0.95 1.00  --  1.15* 1.27*  LATICACIDVEN  --   < > 2.8*  --  3.0* 2.1*  --  1.7 1.2  --   --   --   --   --   VANCOTROUGH  --   --   --   --   --   --  32*  --   --   --   --  24*  --   --   < > = values in this interval not displayed.  Estimated Creatinine Clearance: 65.6 mL/min (by C-G formula based on Cr of 1.27).    No Known Allergies  Antimicrobials this admission: 5/4 Zosyn x1  5/4 Vancomycin (8days) >> 5/7 >>resumed 5/8 5/4 Cefepime (8days) >> 5/6 >> resumed 5/8 5/6 Ceftriaxone >> 5/7  5/6 Azithromycin >> 5/6  5/7 Doxy >> 5/8  Dose adjustments this admission: 5/5 VT = canceled;   -IV access lost; RN thinks 1/2 dose vanc given. Giving 500 mg IV x 1 to complete dose.   -Appears VT then drawn between doses -NOT trough, disregard this level 5/6 1530 VT = 24 on 1g q8, adjusting to 1250 mg IV q12h  Microbiology results: 5/4 BCx: NGTD  5/4 MRSA PCR: positive  5/4 Urine: 1K colonies,  insignificant growth  5/5: Peritoneal fluid: ngtd <24h  5/4: Strep PNeumo: Neg   Thank you for allowing pharmacy to be a part of this patient's care.  Arley Phenixllen Letha Mirabal RPh 08/16/2015, 4:50 PM Pager [phone]

## 2015-08-16 NOTE — Progress Notes (Signed)
SUBJECTIVE:  Obviously confused.  No acute complaints however.  She doesn't notice palpitations   PHYSICAL EXAM Filed Vitals:   08/15/15 2040 08/16/15 0423 08/16/15 0619 08/16/15 1026  BP: 91/57 91/60  88/62  Pulse: 97 83  76  Temp: 98 F (36.7 C) 97.5 F (36.4 C)    TempSrc: Oral Oral    Resp: 22 18    Height:      Weight:   216 lb 4.3 oz (98.1 kg)   SpO2: 98% 97%     General:  No acute distress, chronically ill appearing Lungs:  Clear Heart:  RRR Abdomen:   Positive bowel sounds, no rebound no guarding Extremities:  Moderate ankle edema   LABS: Lab Results  Component Value Date   TROPONINI 0.04* 08/04/2015   Results for orders placed or performed during the hospital encounter of 08/12/15 (from the past 24 hour(s))  Occult blood card to lab, stool     Status: None   Collection Time: 08/15/15  6:58 PM  Result Value Ref Range   Fecal Occult Bld NEGATIVE NEGATIVE  Protime-INR     Status: Abnormal   Collection Time: 08/16/15  3:36 AM  Result Value Ref Range   Prothrombin Time 21.9 (H) 11.6 - 15.2 seconds   INR 2.00 (H) 0.00 - 1.49  CBC     Status: Abnormal   Collection Time: 08/16/15  3:36 AM  Result Value Ref Range   WBC 18.3 (H) 4.0 - 10.5 K/uL   RBC 2.80 (L) 3.87 - 5.11 MIL/uL   Hemoglobin 10.2 (L) 12.0 - 15.0 g/dL   HCT 16.1 (L) 09.6 - 04.5 %   MCV 105.4 (H) 78.0 - 100.0 fL   MCH 36.4 (H) 26.0 - 34.0 pg   MCHC 34.6 30.0 - 36.0 g/dL   RDW 40.9 81.1 - 91.4 %   Platelets 172 150 - 400 K/uL  Comprehensive metabolic panel     Status: Abnormal   Collection Time: 08/16/15  3:36 AM  Result Value Ref Range   Sodium 137 135 - 145 mmol/L   Potassium 4.1 3.5 - 5.1 mmol/L   Chloride 111 101 - 111 mmol/L   CO2 17 (L) 22 - 32 mmol/L   Glucose, Bld 135 (H) 65 - 99 mg/dL   BUN 17 6 - 20 mg/dL   Creatinine, Ser 7.82 (H) 0.44 - 1.00 mg/dL   Calcium 8.0 (L) 8.9 - 10.3 mg/dL   Total Protein 5.3 (L) 6.5 - 8.1 g/dL   Albumin 2.0 (L) 3.5 - 5.0 g/dL   AST 956 (H) 15 - 41  U/L   ALT 34 14 - 54 U/L   Alkaline Phosphatase 115 38 - 126 U/L   Total Bilirubin 6.1 (H) 0.3 - 1.2 mg/dL   GFR calc non Af Amer 49 (L) >60 mL/min   GFR calc Af Amer 56 (L) >60 mL/min   Anion gap 9 5 - 15  Magnesium     Status: None   Collection Time: 08/16/15  3:36 AM  Result Value Ref Range   Magnesium 2.2 1.7 - 2.4 mg/dL  Ammonia     Status: Abnormal   Collection Time: 08/16/15  3:36 AM  Result Value Ref Range   Ammonia 40 (H) 9 - 35 umol/L    Intake/Output Summary (Last 24 hours) at 08/16/15 1029 Last data filed at 08/15/15 1320  Gross per 24 hour  Intake      0 ml  Output    200  ml  Net   -200 ml      ASSESSMENT AND PLAN:  NSVT:    Beta blocker held this morning secondary to low BP.  Spironolactone was reduced yesterday to try to allow for some increased BP to allow titration of beta blocker.  Switched to lopressor yesterday.    Tele reviewed and no ectopy.  Lots of artifact.  No change in therapy.   Fayrene FearingJames [facility] 08/16/2015 10:29 AM

## 2015-08-16 NOTE — Progress Notes (Signed)
PROGRESS NOTE  Rhonda Fuentes WUJ:[id number] DOB: [birth year] DOA: 08/12/2015 PCP: No PCP Per Patient  HPI/Recap of past 24 hours:   remain slightly confused, reported not feeling well, not able to provide more history, on room air, continued cough  abdomen less distended, bilateral lower extremity remain significantly swollen,  Cr worsening  Assessment/Plan: Principal Problem:   Sepsis (HCC) Active Problems:   Alcoholic hepatitis with ascites   Pneumonia   Encephalopathy, hepatic (HCC)   Alcoholic cirrhosis (HCC)   Lactic acidosis   Hypothyroidism   GERD (gastroesophageal reflux disease)   HCAP (healthcare-associated pneumonia)  1-Sepsis (HCC): with concerns for HCAP  -patient recently admitted for PNA and hepatic encephalopathy -patient completed treatment according to instructions; but presented still with SOB, tachypnea, elevated WBC's and soft BP. -patient found to have elevated lactic acid and CXR demonstrating new right lower lobe infiltrates. - sepsis protocol, admitted to stepdown;  lactic acid normalized, bp remain borderline low, d/c ivf, continue stress dose steroids, moved to med/tele on 5/6 -Vanc and cefepime started from admission, now changed to vanc/rocephin/zithro, dropped zithro due to frequent NSVT. Now d/c ivf abx, change to oral doxycycline on 5/7, repeat CT chest with persistent multifocal pna, will broaden abx to vanc/ cefepime.     2-Alcoholic hepatitis/cirrhosis with ascites: s/p paracentesis, paritoneal fluids analysis:  wbc 72, gram stain no organism seen , no ab pain, less likely sbp. -GI was consulted To assist with treatment/care of her cirrhosis  -low sodium diet ordered, d/c ivf,  start low dose spironolactone/lasix (50:20) on 5/6  3-hepatic encephalopathy: most likely; but possible toxic encephalopathy also in the differential  -patient ammonia 47 on admission -she reported not taking lactulose as prescribed prior to admission and was achieving  1 BM every 2 days -will resume lactulose as initially prescribed and adjust further as needed -might benefit of rifaximin as well if lactulose failed to do the job alone  4-Hypothyroidism: -TSH 5.144 -on synthroid  5-GERD (gastroesophageal reflux disease): -continue PPI  6-chronic pain syndrome: - decrease/minimize narcotics currently given low BP and encephalopathy  7. INR elevated: likely from cirrhosis and sepsis, hold prophylaxis heparin, monitor INR  8. Bilateral lower extremity edema: likely from cirrhosis, venous US no DVT  9. Hypokalemia/hypomagnesemia: replace k/mag  10; frequent NSVT: echo adequate EF, no wall motion abnromalities, keep mag>2 and k>4, started betablocker. Not a candidate for amiodarone due to liver impairment. D/c zithromax. Correct mild acidosis with sodium bicarb tabs. Cardiology consulted.  12: cr elevated on 5/7, from vanc? Slightly supratherapeutic, from diuretics? From hypotension? Monitor. vanc d/ced on 5/7, restarted vanc per pharmacy dosing due to multifocal pna. Close monitor cr, renal dosing meds.  13 MRSA colonization: contact precaution, decolonization.  DVT prophylaxis: INR elevated on 5/6, d/c heparin Code Status: Full code Family Communication: no family at bedside  Disposition Plan: pending Consults called:  ED has consulted [facility] for NSVT   Procedures:  US guided paracentesis on 5/4  Antibiotics:  Vanc from admission to 5/7, restarted from 5/8  cefepime from admission to 5/6 , restarted on 5/8  Rocephin/zithro from 5/6 to 5/7  Doxycycline from 5/7 to 5/8.   Objective: BP 91/60 mmHg  Pulse 83  Temp(Src) 97.5 F (36.4 C) (Oral)  Resp 18  Ht 5\' 8"  (1.727 m)  Wt 98.1 kg (216 lb 4.3 oz)  BMI 32.89 kg/m2  SpO2 97%  LMP 10/21/2014 (Approximate)  Intake/Output Summary (Last 24 hours) at 08/16/15 0756 Last data filed at 08/15/15  1320  Gross per 24 hour  Intake    240 ml  Output    200 ml  Net     40  ml   Filed Weights   08/14/15 1120 08/15/15 0415 08/16/15 0619  Weight: 98.7 kg (217 lb 9.5 oz) 98.2 kg (216 lb 7.9 oz) 98.1 kg (216 lb 4.3 oz)    Exam:   General:  Fail, slightly confused  Cardiovascular: RRR  Respiratory: improved aeration, no wheezing  Abdomen: less distended, but soft, NT, positive BS  Musculoskeletal:diffuse 3+bilateral lower extremity Edema  Neuro: slight confusion  Data Reviewed: Basic Metabolic Panel:  Recent Labs Lab 08/12/15 2053 08/13/15 1755 08/14/15 0200 08/14/15 1400 08/15/15 0310 08/16/15 0336  NA  --  137 135 138 136 137  K  --  3.2* 3.5 3.2* 4.3 4.1  CL  --  109 110 114* 110 111  CO2  --  18* 18* 15* 17* 17*  GLUCOSE  --  128* 159* 154* 164* 135*  BUN  --  CREATININE 0.88 0.92 0.95 1.00 1.15* 1.27*  CALCIUM  --  7.3* 7.1* 6.7* 7.7* 8.0*  MG 1.4* 1.3* 1.8 2.1 2.2 2.2  PHOS 3.6  --   --   --   --   --    Liver Function Tests:  Recent Labs Lab 08/12/15 1408 08/13/15 1755 08/14/15 0200 08/16/15 0336  AST 144* 122* 111* 113*  ALT 38 32 27 34  ALKPHOS 131* 111 104 115  BILITOT 8.9* 7.0* 6.8* 6.1*  PROT 6.0* 4.8* 4.7* 5.3*  ALBUMIN 2.3* 1.8* 1.7* 2.0*   No results for input(s): LIPASE, AMYLASE in the last 168 hours.  Recent Labs Lab 08/12/15 1408 08/15/15 0310 08/16/15 0336  AMMONIA 47* 49* 40*   CBC:  Recent Labs Lab 08/12/15 1408 08/14/15 0200 08/15/15 0310 08/16/15 0336  WBC 12.4* 11.0* 17.4* 18.3*  NEUTROABS 10.6*  --   --   --   HGB 11.0* 9.4* 10.4* 10.2*  HCT 31.3* 26.4* 29.4* 29.5*  MCV 108.7* 107.3* 106.1* 105.4*  PLT 236 225 254 172   Cardiac Enzymes:   No results for input(s): CKTOTAL, CKMB, CKMBINDEX, TROPONINI in the last 168 hours. BNP (last 3 results) No results for input(s): BNP in the last 8760 hours.  ProBNP (last 3 results) No results for input(s): PROBNP in the last 8760 hours.  CBG:  Recent Labs Lab 08/12/15 1315  GLUCAP 71    Recent Results (from the past  240 hour(s))  Culture, blood (Routine X 2) w Reflex to ID Panel     Status: None (Preliminary result)   Collection Time: 08/12/15  1:38 PM  Result Value Ref Range Status   Specimen Description BLOOD RIGHT ANTECUBITAL  Final   Special Requests IN PEDIATRIC BOTTLE 5CC  Final   Culture   Final    NO GROWTH 3 DAYS Performed at [facility]    Report Status PENDING  Incomplete  Culture, blood (Routine X 2) w Reflex to ID Panel     Status: None (Preliminary result)   Collection Time: 08/12/15  2:08 PM  Result Value Ref Range Status   Specimen Description BLOOD LEFT ANTECUBITAL  Final   Special Requests IN PEDIATRIC BOTTLE 2 CC  Final   Culture   Final    NO GROWTH 3 DAYS Performed at [facility]    Report Status PENDING  Incomplete  MRSA PCR Screening     Status:  Abnormal   Collection Time: 08/12/15  4:45 PM  Result Value Ref Range Status   MRSA by PCR POSITIVE (A) NEGATIVE Final    Comment:        The GeneXpert MRSA Assay (FDA approved for NASAL specimens only), is one component of a comprehensive MRSA colonization surveillance program. It is not intended to diagnose MRSA infection nor to guide or monitor treatment for MRSA infections. RESULT CALLED TO, READ BACK BY AND VERIFIED WITH: CAROLYN CREECH,RN [id number] @ 1805 BY J SCOTTON   Urine culture     Status: Abnormal   Collection Time: 08/12/15  9:23 PM  Result Value Ref Range Status   Specimen Description URINE, CLEAN CATCH  Final   Special Requests NONE  Final   Culture (A)  Final    1,000 COLONIES/mL INSIGNIFICANT GROWTH Performed at [facility]    Report Status 08/14/2015 FINAL  Final  Culture, body fluid-bottle     Status: None (Preliminary result)   Collection Time: 08/13/15 10:07 AM  Result Value Ref Range Status   Specimen Description FLUID PERITONEAL  Final   Special Requests BOTTLES DRAWN AEROBIC AND ANAEROBIC 10CC  Final   Culture   Final    NO GROWTH 2 DAYS Performed at [facility]    Report Status PENDING  Incomplete  Gram stain     Status: None   Collection Time: 08/13/15 10:07 AM  Result Value Ref Range Status   Specimen Description FLUID PERITONEAL  Final   Special Requests NONE  Final   Gram Stain   Final    FEW WBC PRESENT, PREDOMINANTLY MONONUCLEAR NO ORGANISMS SEEN Performed at [facility]    Report Status 08/13/2015 FINAL  Final     Studies: Dg Chest 2 View  08/15/2015  CLINICAL DATA:  50 year old female with a history of pneumonia EXAM: CHEST - 2 VIEW COMPARISON:  08/12/2015 FINDINGS: Cardiomediastinal silhouette projects within normal limits in size and contour. Lung volumes remain low. Airspace opacity of the right upper lobe, abutting the minor fissure. Similar appearance of perihilar opacities and left sided linear opacities. No pleural effusion. No pneumothorax. Interval placement of right upper extremity PICC, with the tip appearing to terminate at the superior cavoatrial junction. No displaced fracture. Unremarkable appearance of the upper abdomen. IMPRESSION: Similar appearance of bilateral opacities, involving predominantly the right upper lobe, compatible with pneumonia. Interval placement of right upper extremity PICC Signed, Yvone Neu. Loreta Ave, DO [facility] Electronically Signed   By: Gilmer Mor D.O.   On: 08/15/2015 19:03    Scheduled Meds: . Chlorhexidine Gluconate Cloth  6 each Topical Q0600  . doxycycline  100 mg Oral Q12H  . folic acid  1 mg Oral Daily  . furosemide  20 mg Oral Daily  . guaiFENesin  600 mg Oral BID  . hydrocortisone sod succinate (SOLU-CORTEF) inj  50 mg Intravenous Q12H  . lactulose  30 g Oral BID  . levothyroxine  50 mcg Oral QAC breakfast  . metoprolol tartrate  12.5 mg Oral BID  . mupirocin ointment   Nasal BID  . pantoprazole  40 mg Oral BID  . sodium bicarbonate  650 mg Oral BID  . sodium chloride flush  10-40 mL Intracatheter Q12H  .  spironolactone  25 mg Oral Daily  . thiamine  100 mg Oral Daily    Continuous Infusions:     Time spent:  Skylier Kretschmer MD, PhD  [facility] Pager  [phone]. If 7PM-7AM, please contact night-coverage at [email or web address], password [email or web address] 08/16/2015, 7:56 AM  LOS: 4 days

## 2015-08-17 ENCOUNTER — Inpatient Hospital Stay (HOSPITAL_COMMUNITY): Payer: BLUE CROSS/BLUE SHIELD

## 2015-08-17 DIAGNOSIS — R4182 Altered mental status, unspecified: Secondary | ICD-10-CM

## 2015-08-17 LAB — COMPREHENSIVE METABOLIC PANEL
ALBUMIN: 2.2 g/dL — AB (ref 3.5–5.0)
ALK PHOS: 110 U/L (ref 38–126)
ALT: 45 U/L (ref 14–54)
AST: 166 U/L — ABNORMAL HIGH (ref 15–41)
Anion gap: 8 (ref 5–15)
BUN: 20 mg/dL (ref 6–20)
CALCIUM: 8.4 mg/dL — AB (ref 8.9–10.3)
CO2: 18 mmol/L — AB (ref 22–32)
CREATININE: 1.44 mg/dL — AB (ref 0.44–1.00)
Chloride: 113 mmol/L — ABNORMAL HIGH (ref 101–111)
GFR calc non Af Amer: 42 mL/min — ABNORMAL LOW (ref >60)
GFR, EST AFRICAN AMERICAN: 49 mL/min — AB (ref >60)
GLUCOSE: 141 mg/dL — AB (ref 65–99)
Potassium: 3.8 mmol/L (ref 3.5–5.1)
SODIUM: 139 mmol/L (ref 135–145)
Total Bilirubin: 6.2 mg/dL — ABNORMAL HIGH (ref 0.3–1.2)
Total Protein: 5.1 g/dL — ABNORMAL LOW (ref 6.5–8.1)

## 2015-08-17 LAB — CBC
HCT: 30.3 % — ABNORMAL LOW (ref 36.0–46.0)
Hemoglobin: 10.7 g/dL — ABNORMAL LOW (ref 12.0–15.0)
MCH: 37.7 pg — AB (ref 26.0–34.0)
MCHC: 35.3 g/dL (ref 30.0–36.0)
MCV: 106.7 fL — ABNORMAL HIGH (ref 78.0–100.0)
Platelets: 103 10*3/uL — ABNORMAL LOW (ref 150–400)
RBC: 2.84 MIL/uL — AB (ref 3.87–5.11)
RDW: 14.7 % (ref 11.5–15.5)
WBC: 16 10*3/uL — ABNORMAL HIGH (ref 4.0–10.5)

## 2015-08-17 LAB — LACTIC ACID, PLASMA: Lactic Acid, Venous: 1.7 mmol/L (ref 0.5–2.0)

## 2015-08-17 LAB — CULTURE, BLOOD (ROUTINE X 2)
CULTURE: NO GROWTH
Culture: NO GROWTH

## 2015-08-17 LAB — AMMONIA: Ammonia: 30 umol/L (ref 9–35)

## 2015-08-17 LAB — PROTIME-INR
INR: 2.02 — ABNORMAL HIGH (ref 0.00–1.49)
PROTHROMBIN TIME: 22.1 s — AB (ref 11.6–15.2)

## 2015-08-17 MED ORDER — DEXTROSE 5 % IV SOLN
1.0000 g | Freq: Two times a day (BID) | INTRAVENOUS | Status: DC
  Filled 2015-08-17 (×3): qty 1

## 2015-08-17 MED ORDER — PREDNISONE 5 MG PO TABS
10.0000 mg | ORAL_TABLET | Freq: Every day | ORAL | Status: AC
  Administered 2015-08-19: 10 mg via ORAL
  Filled 2015-08-17: qty 2

## 2015-08-17 MED ORDER — VANCOMYCIN HCL IN DEXTROSE 750-5 MG/150ML-% IV SOLN
750.0000 mg | Freq: Two times a day (BID) | INTRAVENOUS | Status: DC
  Filled 2015-08-17 (×3): qty 150

## 2015-08-17 MED ORDER — PREDNISONE 50 MG PO TABS
50.0000 mg | ORAL_TABLET | Freq: Every day | ORAL | Status: AC
  Administered 2015-08-17: 50 mg via ORAL
  Filled 2015-08-17: qty 1

## 2015-08-17 MED ORDER — RIFAXIMIN 550 MG PO TABS
550.0000 mg | ORAL_TABLET | Freq: Two times a day (BID) | ORAL | Status: DC
  Administered 2015-08-17 – 2015-08-27 (×19): 550 mg via ORAL
  Filled 2015-08-17 (×22): qty 1

## 2015-08-17 MED ORDER — PREDNISONE 20 MG PO TABS
20.0000 mg | ORAL_TABLET | Freq: Every day | ORAL | Status: AC
  Administered 2015-08-18: 20 mg via ORAL
  Filled 2015-08-17: qty 1

## 2015-08-17 NOTE — Progress Notes (Signed)
Patient is refusing IV access. Pulled out PICC line last night. Dr. Roda ShuttersXu aware.

## 2015-08-17 NOTE — Progress Notes (Signed)
[facility] Progress Note  Subjective: She denies abdominal pain. She denies shortness of breath. She is sitting up in bed. She is complaining however that people here at the hospital are playing games and she feels like they are stabbing her in the back. When I asked her what she meant by that she said people are taking days off. Then she said she was trying to save her marriage.  Objective: Vital signs in last 24 hours: Temp:  [97.6 F (36.4 C)-98.2 F (36.8 C)] 98 F (36.7 C) (05/09 0630) Pulse Rate:  [76-95] 89 (05/09 0630) Resp:  [20] 20 (05/09 0630) BP: (88-102)/(53-62) 98/57 mmHg (05/09 0630) SpO2:  [98 %-100 %] 100 % (05/09 0630) Weight:  [97.9 kg (215 lb 13.3 oz)] 97.9 kg (215 lb 13.3 oz) (05/09 0630) Weight change: -0.2 kg (-7.1 oz)   PE:  She does not appear in any acute distress  Heart regular rhythm no murmurs  Lungs clear  Abdomen: Bowel sounds present, soft, nontender  Lab Results: Results for orders placed or performed during the hospital encounter of 08/12/15 (from the past 24 hour(s))  Protime-INR     Status: Abnormal   Collection Time: 08/17/15  5:11 AM  Result Value Ref Range   Prothrombin Time 22.1 (H) 11.6 - 15.2 seconds   INR 2.02 (H) 0.00 - 1.49  CBC     Status: Abnormal   Collection Time: 08/17/15  5:11 AM  Result Value Ref Range   WBC 16.0 (H) 4.0 - 10.5 K/uL   RBC 2.84 (L) 3.87 - 5.11 MIL/uL   Hemoglobin 10.7 (L) 12.0 - 15.0 g/dL   HCT 16.130.3 (L) 09.636.0 - 04.546.0 %   MCV 106.7 (H) 78.0 - 100.0 fL   MCH 37.7 (H) 26.0 - 34.0 pg   MCHC 35.3 30.0 - 36.0 g/dL   RDW 40.914.7 81.111.5 - 91.415.5 %   Platelets 103 (L) 150 - 400 K/uL  Comprehensive metabolic panel     Status: Abnormal   Collection Time: 08/17/15  5:11 AM  Result Value Ref Range   Sodium 139 135 - 145 mmol/L   Potassium 3.8 3.5 - 5.1 mmol/L   Chloride 113 (H) 101 - 111 mmol/L   CO2 18 (L) 22 - 32 mmol/L   Glucose, Bld 141 (H) 65 - 99 mg/dL   BUN 20 6 - 20 mg/dL   Creatinine, Ser 7.821.44  (H) 0.44 - 1.00 mg/dL   Calcium 8.4 (L) 8.9 - 10.3 mg/dL   Total Protein 5.1 (L) 6.5 - 8.1 g/dL   Albumin 2.2 (L) 3.5 - 5.0 g/dL   AST 956166 (H) 15 - 41 U/L   ALT 45 14 - 54 U/L   Alkaline Phosphatase 110 38 - 126 U/L   Total Bilirubin 6.2 (H) 0.3 - 1.2 mg/dL   GFR calc non Af Amer 42 (L) >60 mL/min   GFR calc Af Amer 49 (L) >60 mL/min   Anion gap 8 5 - 15  Ammonia     Status: None   Collection Time: 08/17/15  5:11 AM  Result Value Ref Range   Ammonia 30 9 - 35 umol/L  Lactic acid, plasma     Status: None   Collection Time: 08/17/15  5:11 AM  Result Value Ref Range   Lactic Acid, Venous 1.7 0.5 - 2.0 mmol/L    Studies/Results: Ct Chest Wo Contrast  08/16/2015  CLINICAL DATA:  Cirrhosis, pneumonia, sepsis, alcoholic hepatitis EXAM: CT CHEST WITHOUT CONTRAST TECHNIQUE: Multidetector CT imaging  of the chest was performed following the standard protocol without IV contrast. COMPARISON:  Chest x-ray 08/15/2015 and images of the lung bases CT scan 07/16/2015 FINDINGS: Mediastinum/Lymph Nodes: Central airways are patent. There is right arm PICC line with tip in SVC right atrium junction. No mediastinal adenopathy. Heart size within normal limits. Trace posterior pericardial effusion. Lungs/Pleura: There is trace left pleural effusion. Atelectasis or infiltrate is noted in left lower lobe posteriorly. There is patchy infiltrate with air bronchogram in right upper lobe. Patchy infiltrate is noted in left upper lobe. Findings are highly suspicious for multifocal pneumonia. Small amount of atelectasis and trace fluid is noted along the right minor fissure. Upper abdomen: There is perihepatic ascites. Micro nodular liver contour suspicious for cirrhosis. Borderline splenomegaly. The patient is status post gastric bypass surgery. No adrenal gland mass is noted in visualized upper abdomen. Musculoskeletal: No destructive bony lesions are noted. Mild degenerative changes thoracic spine. No destructive rib  lesions are noted. There is no axillary adenopathy. IMPRESSION: 1. There is infiltrate with air bronchogram in right upper lobe. Patchy infiltrate is noted in left upper lobe. Small left pleural effusion with left lower lobe posterior atelectasis or infiltrate. Findings highly suspicious for multifocal pneumonia. 2. No mediastinal adenopathy. 3. Right arm PICC line with tip in SVC right atrium junction. 4. There is perihepatic ascites. Micro nodular contour of the liver highly suspicious for cirrhosis. Borderline splenomegaly. 5. Status post gastric bypass surgery. 6. Mild degenerative changes thoracic spine. Electronically Signed   By: Natasha Mead M.D.   On: 08/16/2015 10:13      Assessment: Alcoholic cirrhosis of the liver with portal hypertension and ascites and hepatic encephalopathy  Pneumonia  Plan:   Continue current medical management.    Gwenevere Abbot 08/17/2015, 8:54 AM  Pager: [phone] If no answer or after 5 PM call [phone]

## 2015-08-17 NOTE — Progress Notes (Signed)
Pharmacy Antibiotic Note  Lucia Bitterrinia Antonson is a 50 y.o. female admitted on 08/12/2015 with PNA and hepatic encephalopathy.  Vanc and cefepime started from admission, but narrowed to oral doxycycline on 5/7.  Repeat CT chest with persistent multifocal pna, and MD will broaden abx to vanc/ cefepime.  Pharmacy has been consulted for  Dosing. Today, SCr continues to increase.  Noted that pt pulled out PICC line on 5/8 PM.  Rn has been unable to get PIV access and give IV medications.  She is currently refusing a new PICC line and per RN has been refusing PO medications as well.    Plan:  Decrease to cefepime 1g IV q12h  Decrease to Vancomycin 750 mg IV q12h.  Measure Vanc trough at steady state.  Follow up renal fxn, culture results, and clinical course.  Follow up IV access, RN to resume lower doses if IV access obtained.  If no IV access, MD to address IV antibiotic orders.  Height: 5\' 8"  (172.7 cm) Weight: 215 lb 13.3 oz (97.9 kg) IBW/kg (Calculated) : 63.9  Temp (24hrs), Avg:97.9 F (36.6 C), Min:97.6 F (36.4 C), Max:98.2 F (36.8 C)   Recent Labs Lab 08/12/15 1408  08/12/15 2054 08/13/15 1755 08/13/15 1930 08/13/15 1952 08/14/15 0120 08/14/15 0200 08/14/15 1400 08/14/15 1535 08/15/15 0310 08/16/15 0336 08/17/15 0511  WBC 12.4*  --   --   --   --   --   --  11.0*  --   --  17.4* 18.3* 16.0*  CREATININE 1.01*  < >  --  0.92  --   --   --  0.95 1.00  --  1.15* 1.27* 1.44*  LATICACIDVEN  --   < > 3.0* 2.1*  --  1.7 1.2  --   --   --   --   --  1.7  VANCOTROUGH  --   --   --   --  32*  --   --   --   --  24*  --   --   --   < > = values in this interval not displayed.  Estimated Creatinine Clearance: 57.8 mL/min (by C-G formula based on Cr of 1.44).    No Known Allergies  Antimicrobials this admission: 5/4 Zosyn x1 5/4 Vancomycin (8days) >> 5/7, resumed 5/8 >> 5/4 Cefepime (8days) >> 5/6, resumed 5/8 >> 5/6 Ceftriaxone >> 5/7 5/6 Azithromycin >> 5/6 5/7 Doxy >>  5/8  Levels/dose changes this admission: 5/5 VT = canceled; NOT trough, disregard this level 5/6 1530 VT = 24 on 1g q8, adjusting to 1250 mg IV q12h (SCr 0.95)  Microbiology results: 5/4 BCx: ngtd 5/4 MRSA PCR: positive 5/4 Urine: 1K colonies, insignificant growth 5/4: Strep PNeumo: neg 5/5: Peritoneal fluid: ngtd  Thank you for allowing pharmacy to be a part of this patient's care.  Lynann Beaverhristine Lyriq Finerty PharmD, BCPS Pager [phone] 08/17/2015 1:49 PM

## 2015-08-17 NOTE — Progress Notes (Addendum)
I was asked to obtain PIV access because PICC could not be inserted before late this afternoon.   Pt refused PIV and stated,  "I don't want another PICC line".    "I just had one and they took it out". (Pt pulled PICC out)  I explained reason for needing IV access however pt refused both PIV and PICC.  Primary RN made aware.

## 2015-08-17 NOTE — Progress Notes (Signed)
SUBJECTIVE:  Obviously confused.  No acute complaints however.  She doesn't notice palpitations   PHYSICAL EXAM Filed Vitals:   08/16/15 1026 08/16/15 1547 08/16/15 2222 08/17/15 0630  BP: 88/62 102/57 98/53 98/57   Pulse: 76 95 94 89  Temp:  98.2 F (36.8 C) 97.6 F (36.4 C) 98 F (36.7 C)  TempSrc:  Oral Oral Oral  Resp:  Height:      Weight:    215 lb 13.3 oz (97.9 kg)  SpO2:  98% 99% 100%   General:  No acute distress, chronically ill appearing Lungs:  Clear Heart:  RRR Abdomen:   Positive bowel sounds, no rebound no guarding Extremities:  Moderate edema   LABS:  Results for orders placed or performed during the hospital encounter of 08/12/15 (from the past 24 hour(s))  Protime-INR     Status: Abnormal   Collection Time: 08/17/15  5:11 AM  Result Value Ref Range   Prothrombin Time 22.1 (H) 11.6 - 15.2 seconds   INR 2.02 (H) 0.00 - 1.49  CBC     Status: Abnormal   Collection Time: 08/17/15  5:11 AM  Result Value Ref Range   WBC 16.0 (H) 4.0 - 10.5 K/uL   RBC 2.84 (L) 3.87 - 5.11 MIL/uL   Hemoglobin 10.7 (L) 12.0 - 15.0 g/dL   HCT 29.5 (L) 62.1 - 30.8 %   MCV 106.7 (H) 78.0 - 100.0 fL   MCH 37.7 (H) 26.0 - 34.0 pg   MCHC 35.3 30.0 - 36.0 g/dL   RDW 65.7 84.6 - 96.2 %   Platelets 103 (L) 150 - 400 K/uL  Comprehensive metabolic panel     Status: Abnormal   Collection Time: 08/17/15  5:11 AM  Result Value Ref Range   Sodium 139 135 - 145 mmol/L   Potassium 3.8 3.5 - 5.1 mmol/L   Chloride 113 (H) 101 - 111 mmol/L   CO2 18 (L) 22 - 32 mmol/L   Glucose, Bld 141 (H) 65 - 99 mg/dL   BUN 20 6 - 20 mg/dL   Creatinine, Ser 9.52 (H) 0.44 - 1.00 mg/dL   Calcium 8.4 (L) 8.9 - 10.3 mg/dL   Total Protein 5.1 (L) 6.5 - 8.1 g/dL   Albumin 2.2 (L) 3.5 - 5.0 g/dL   AST 841 (H) 15 - 41 U/L   ALT 45 14 - 54 U/L   Alkaline Phosphatase 110 38 - 126 U/L   Total Bilirubin 6.2 (H) 0.3 - 1.2 mg/dL   GFR calc non Af Amer 42 (L) >60 mL/min   GFR calc Af Amer 49 (L)  >60 mL/min   Anion gap 8 5 - 15  Ammonia     Status: None   Collection Time: 08/17/15  5:11 AM  Result Value Ref Range   Ammonia 30 9 - 35 umol/L  Lactic acid, plasma     Status: None   Collection Time: 08/17/15  5:11 AM  Result Value Ref Range   Lactic Acid, Venous 1.7 0.5 - 2.0 mmol/L    Intake/Output Summary (Last 24 hours) at 08/17/15 1043 Last data filed at 08/16/15 1900  Gross per 24 hour  Intake    300 ml  Output      0 ml  Net    300 ml   CT:  IMPRESSION: 1. There is infiltrate with air bronchogram in right upper lobe. Patchy infiltrate is noted in left upper lobe. Small left pleural effusion with left  lower lobe posterior atelectasis or infiltrate. Findings highly suspicious for multifocal pneumonia. 2. No mediastinal adenopathy. 3. Right arm PICC line with tip in SVC right atrium junction. 4. There is perihepatic ascites. Micro nodular contour of the liver highly suspicious for cirrhosis. Borderline splenomegaly. 5. Status post gastric bypass surgery. 6. Mild degenerative changes thoracic spine.   ASSESSMENT AND PLAN:  NSVT:    She was able to get her PM dose of beta blocker.    BP seems to be slightly higher and stable.  Continue current therapy.   Tele reviewed.  No PVCs.  Only NSR.  NO further work up from a cardiac standpoint.  No Class I  indication for continued telemetry.    Please call with further questions.     Fayrene FearingJames Sequoia Hospitalochrein 08/17/2015 10:43 AM

## 2015-08-17 NOTE — Plan of Care (Signed)
Problem: Safety: Goal: Ability to remain free from injury will improve Outcome: Not Progressing Pt is disoriented at times with poor judgement and safety awareness.

## 2015-08-17 NOTE — Progress Notes (Signed)
Patient is refusing CT scan of head. MD aware. Sister [name] contacted.

## 2015-08-17 NOTE — Progress Notes (Addendum)
PROGRESS NOTE  Rhonda Fuentes ZOX:[id number] DOB: 1965-06-27 DOA: 08/12/2015 PCP: No PCP Per Patient  Brief Summary:  Rhonda Fuentes is a 50 y.o. female with PMH significant for alcoholic cirrhosis, hypothyroidism, chronic pain syndrome and recent admission due to PNA and encephalopathy (admitted to [facility]). Patient presented from GI office due to ongoing jaundice, confusion, low BP and difficulty breathing. In ED found to have abnormal CXR (with new infiltrates component) and features of sepsis. Patient endorses swelling of her legs and abdomen.   She was hospitalized in [place] [facility] on 4/6-4/10 and 4/24-4/26. Her sister ilds [name] thinks she is prematurely discharged from prior hospitalization,  Hallucination documented from prior hospitalization, she kept having hallucination here.   HPI/Recap of past 24 hours:   remain slightly confused, intermittent hallucination, no fever  not able to provide more history, on room air, has not noticed cough per RN or during my encounter today  abdomen less distended, bilateral lower extremity remain significantly swollen,  Cr worsening  Assessment/Plan: Principal Problem:   Sepsis (HCC) Active Problems:   Alcoholic hepatitis with ascites   Pneumonia   Encephalopathy, hepatic (HCC)   Alcoholic cirrhosis (HCC)   Lactic acidosis   Hypothyroidism   GERD (gastroesophageal reflux disease)   HCAP (healthcare-associated pneumonia)  1-Sepsis (HCC): with concerns for HCAP  -patient recently admitted for PNA and hepatic encephalopathy -patient completed treatment according to instructions; but presented still with SOB, tachypnea, elevated WBC's and soft BP. -patient found to have elevated lactic acid and CXR demonstrating new right lower lobe infiltrates. - sepsis protocol, admitted to stepdown;  lactic acid normalized, bp remain borderline low, d/c ivf, receivedstress dose steroids, moved to med/tele on 5/6 -Vanc and cefepime started from admission,  then changed to vanc/rocephin/zithro, dropped zithro due to frequent NSVT. Then changed to oral doxycycline on 5/7, repeat CT chest with persistent multifocal pna, restarted vanc/ cefepime.  -poor iv access, patient pulled out piccline,and refusing picc line  to be replaced, i have talked to her sister, her sister is going to try to convince her to have picc placed, I have called psychiatry for capacity eval    2-Alcoholic hepatitis/cirrhosis with ascites: -negative hepatitis panel, ascites cytology no malignant cells reported, CEA/afp negative, family reported patient took lots of tylenol for pain control after ankle surgery a few months ago, tylenol level low, + family history of alcohol -s/p paracentesis, paritoneal fluids analysis:  wbc 72, gram stain no organism seen , no ab pain, no sbp. -GI was consulted To assist with treatment/care of her cirrhosis  -low sodium diet ordered, d/c ivf,  start low dose spironolactone/lasix on 5/6, titrate up if bp and cr allows.  3-confusion, hallucination, delirium, per chart review she has been having hallucination during last two hospitalization, confusion likely combination of alcohol withdrawal, wernicke's, infection, hepatic encephalopathy: -on lactulose, started rifaximin, ammonia better , but remain intermittent hallucination, confusion -ct head pending, psychiatry consulted for capacity eval, alcohol abuse, delirium, hallucination, family aware.   4-Hypothyroidism: -TSH 5.144 -on synthroid  5-GERD (gastroesophageal reflux disease): -continue PPI  6-chronic pain syndrome: - decrease/minimize narcotics currently given low BP and encephalopathy -has not required any pain meds in the hospital  7. INR elevated: likely from cirrhosis and sepsis, hold prophylaxis heparin, monitor INR  8. Bilateral lower extremity edema: likely from cirrhosis, venous US no DVT  9. Hypokalemia/hypomagnesemia: replace k/mag  10; frequent NSVT: echo adequate EF,  no wall motion abnromalities, keep mag>2 and k>4, started betablocker. Not a candidate for  amiodarone due to liver impairment. D/c zithromax. Correct mild acidosis with sodium bicarb tabs. Better, Cardiology consulted.  11: cr elevated on 5/7, from vanc? Slightly supratherapeutic, from diuretics? From hypotension? Hepatorenal? Monitor. vanc d/ced on 5/7, restarted vanc per pharmacy dosing due to multifocal pna. Close monitor cr, renal dosing meds.  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  DVT prophylaxis: INR elevated on 5/6, d/c heparin Code Status: Full code Family Communication: i have talked to patient's fiance Scott on the phone on 5/8 and 5/9, updated sister Old [name] over the phone on 5/9 Disposition Plan: pending, not sure if safe to discharge home due to confusion, but patient passed PT eval.  I have discussed with care manager and patient's sister, will need [facility] if does not qualify for placement. Consults called:  [facility] for cirrhosis and related conditions Cardiology for NSVT   Procedures:  US guided paracentesis on 5/4  Antibiotics:  Vanc from admission to 5/7, restarted from 5/8  cefepime from admission to 5/6 , restarted on 5/8  Rocephin/zithro from 5/6 to 5/7  Doxycycline from 5/7 to 5/8.   Objective: BP 98/57 mmHg  Pulse 89  Temp(Src) 98 F (36.7 C) (Oral)  Resp 20  Ht 5\' 8"  (1.727 m)  Wt 97.9 kg (215 lb 13.3 oz)  BMI 32.82 kg/m2  SpO2 100%  LMP 10/21/2014 (Approximate)  Intake/Output Summary (Last 24 hours) at 08/17/15 0901 Last data filed at 08/16/15 1900  Gross per 24 hour  Intake    300 ml  Output      0 ml  Net    300 ml   Filed Weights   08/15/15 0415 08/16/15 0619 08/17/15 0630  Weight: 98.2 kg (216 lb 7.9 oz) 98.1 kg (216 lb 4.3 oz) 97.9 kg (215 lb 13.3 oz)    Exam:   General:  Fail, slightly confused, + hallucination  Cardiovascular: RRR  Respiratory: improved aeration, no wheezing  Abdomen:  less distended, but soft, NT, positive BS  Musculoskeletal:diffuse 3+bilateral lower extremity Edema  Neuro: slight confusion, + hallucination  Data Reviewed: Basic Metabolic Panel:  Recent Labs Lab 08/12/15 2053 08/13/15 1755 08/14/15 0200 08/14/15 1400 08/15/15 0310 08/16/15 0336 08/17/15 0511  NA  --  137 135 138 136 137 139  K  --  3.2* 3.5 3.2* 4.3 4.1 3.8  CL  --  109 110 114* 110 111 113*  CO2  --  18* 18* 15* 17* 17* 18*  GLUCOSE  --  128* 159* 154* 164* 135* 141*  BUN  --  9 10 10 13 17 20   CREATININE 0.88 0.92 0.95 1.00 1.15* 1.27* 1.44*  CALCIUM  --  7.3* 7.1* 6.7* 7.7* 8.0* 8.4*  MG 1.4* 1.3* 1.8 2.1 2.2 2.2  --   PHOS 3.6  --   --   --   --   --   --    Liver Function Tests:  Recent Labs Lab 08/12/15 1408 08/13/15 1755 08/14/15 0200 08/16/15 0336 08/17/15 0511  AST 144* 122* 111* 113* 166*  ALT 38 32 27 34 45  ALKPHOS 131* 111 104 115 110  BILITOT 8.9* 7.0* 6.8* 6.1* 6.2*  PROT 6.0* 4.8* 4.7* 5.3* 5.1*  ALBUMIN 2.3* 1.8* 1.7* 2.0* 2.2*   No results for input(s): LIPASE, AMYLASE in the last 168 hours.  Recent Labs Lab 08/12/15 1408 08/15/15 0310 08/16/15 0336 08/17/15 0511  AMMONIA 47* 49* 40* 30   CBC:  Recent Labs Lab 08/12/15 1408 08/14/15 0200 08/15/15  0310 08/16/15 0336 08/17/15 0511  WBC 12.4* 11.0* 17.4* 18.3* 16.0*  NEUTROABS 10.6*  --   --   --   --   HGB 11.0* 9.4* 10.4* 10.2* 10.7*  HCT 31.3* 26.4* 29.4* 29.5* 30.3*  MCV 108.7* 107.3* 106.1* 105.4* 106.7*  PLT 236 225 254 172 103*   Cardiac Enzymes:   No results for input(s): CKTOTAL, CKMB, CKMBINDEX, TROPONINI in the last 168 hours. BNP (last 3 results) No results for input(s): BNP in the last 8760 hours.  ProBNP (last 3 results) No results for input(s): PROBNP in the last 8760 hours.  CBG:  Recent Labs Lab 08/12/15 1315  GLUCAP 71    Recent Results (from the past 240 hour(s))  Culture, blood (Routine X 2) w Reflex to ID Panel     Status: None (Preliminary  result)   Collection Time: 08/12/15  1:38 PM  Result Value Ref Range Status   Specimen Description BLOOD RIGHT ANTECUBITAL  Final   Special Requests IN PEDIATRIC BOTTLE 5CC  Final   Culture   Final    NO GROWTH 4 DAYS Performed at [facility]    Report Status PENDING  Incomplete  Culture, blood (Routine X 2) w Reflex to ID Panel     Status: None (Preliminary result)   Collection Time: 08/12/15  2:08 PM  Result Value Ref Range Status   Specimen Description BLOOD LEFT ANTECUBITAL  Final   Special Requests IN PEDIATRIC BOTTLE 2 CC  Final   Culture   Final    NO GROWTH 4 DAYS Performed at [facility]    Report Status PENDING  Incomplete  MRSA PCR Screening     Status: Abnormal   Collection Time: 08/12/15  4:45 PM  Result Value Ref Range Status   MRSA by PCR POSITIVE (A) NEGATIVE Final    Comment:        The GeneXpert MRSA Assay (FDA approved for NASAL specimens only), is one component of a comprehensive MRSA colonization surveillance program. It is not intended to diagnose MRSA infection nor to guide or monitor treatment for MRSA infections. RESULT CALLED TO, READ BACK BY AND VERIFIED WITH: CAROLYN CREECH,RN [id number] @ 1805 BY J SCOTTON   Urine culture     Status: Abnormal   Collection Time: 08/12/15  9:23 PM  Result Value Ref Range Status   Specimen Description URINE, CLEAN CATCH  Final   Special Requests NONE  Final   Culture (A)  Final    1,000 COLONIES/mL INSIGNIFICANT GROWTH Performed at [facility]    Report Status 08/14/2015 FINAL  Final  Culture, body fluid-bottle     Status: None (Preliminary result)   Collection Time: 08/13/15 10:07 AM  Result Value Ref Range Status   Specimen Description FLUID PERITONEAL  Final   Special Requests BOTTLES DRAWN AEROBIC AND ANAEROBIC 10CC  Final   Culture   Final    NO GROWTH 3 DAYS Performed at [facility]    Report Status PENDING  Incomplete  Gram stain     Status: None   Collection Time:  08/13/15 10:07 AM  Result Value Ref Range Status   Specimen Description FLUID PERITONEAL  Final   Special Requests NONE  Final   Gram Stain   Final    FEW WBC PRESENT, PREDOMINANTLY MONONUCLEAR NO ORGANISMS SEEN Performed at [facility]    Report Status 08/13/2015 FINAL  Final     Studies: Ct Chest Wo Contrast  08/16/2015  CLINICAL DATA:  Cirrhosis, pneumonia, sepsis, alcoholic hepatitis EXAM: CT CHEST WITHOUT CONTRAST TECHNIQUE: Multidetector CT imaging of the chest was performed following the standard protocol without IV contrast. COMPARISON:  Chest x-ray 08/15/2015 and images of the lung bases CT scan 07/16/2015 FINDINGS: Mediastinum/Lymph Nodes: Central airways are patent. There is right arm PICC line with tip in SVC right atrium junction. No mediastinal adenopathy. Heart size within normal limits. Trace posterior pericardial effusion. Lungs/Pleura: There is trace left pleural effusion. Atelectasis or infiltrate is noted in left lower lobe posteriorly. There is patchy infiltrate with air bronchogram in right upper lobe. Patchy infiltrate is noted in left upper lobe. Findings are highly suspicious for multifocal pneumonia. Small amount of atelectasis and trace fluid is noted along the right minor fissure. Upper abdomen: There is perihepatic ascites. Micro nodular liver contour suspicious for cirrhosis. Borderline splenomegaly. The patient is status post gastric bypass surgery. No adrenal gland mass is noted in visualized upper abdomen. Musculoskeletal: No destructive bony lesions are noted. Mild degenerative changes thoracic spine. No destructive rib lesions are noted. There is no axillary adenopathy. IMPRESSION: 1. There is infiltrate with air bronchogram in right upper lobe. Patchy infiltrate is noted in left upper lobe. Small left pleural effusion with left lower lobe posterior atelectasis or infiltrate. Findings highly suspicious for multifocal pneumonia. 2. No mediastinal adenopathy. 3.  Right arm PICC line with tip in SVC right atrium junction. 4. There is perihepatic ascites. Micro nodular contour of the liver highly suspicious for cirrhosis. Borderline splenomegaly. 5. Status post gastric bypass surgery. 6. Mild degenerative changes thoracic spine. Electronically Signed   By: Natasha Mead M.D.   On: 08/16/2015 10:13    Scheduled Meds: . ceFEPime (MAXIPIME) IV  1 g Intravenous Q8H  . Chlorhexidine Gluconate Cloth  6 each Topical Q0600  . folic acid  1 mg Oral Daily  . furosemide  20 mg Oral Daily  . guaiFENesin  600 mg Oral BID  . lactulose  30 g Oral BID  . levothyroxine  50 mcg Oral QAC breakfast  . metoprolol tartrate  12.5 mg Oral BID  . mupirocin ointment   Nasal BID  . pantoprazole  40 mg Oral BID  . [START ON 08/20/2015] predniSONE  10 mg Oral Q breakfast  . [START ON 08/19/2015] predniSONE  20 mg Oral Q breakfast  . predniSONE  50 mg Oral Q breakfast  . sodium bicarbonate  650 mg Oral BID  . sodium chloride flush  10-40 mL Intracatheter Q12H  . spironolactone  25 mg Oral Daily  . thiamine  100 mg Oral Daily  . vancomycin  1,250 mg Intravenous Q12H    Continuous Infusions:     Time spent:  Dannon Perlow MD, PhD  [facility] Pager [phone]. If 7PM-7AM, please contact night-coverage at [email or web address], password [facility] 08/17/2015, 9:01 AM  LOS: 5 days

## 2015-08-18 DIAGNOSIS — R410 Disorientation, unspecified: Secondary | ICD-10-CM

## 2015-08-18 LAB — CULTURE, BODY FLUID-BOTTLE

## 2015-08-18 LAB — CULTURE, BODY FLUID W GRAM STAIN -BOTTLE: Culture: NO GROWTH

## 2015-08-18 MED ORDER — CEFPODOXIME PROXETIL 200 MG PO TABS
200.0000 mg | ORAL_TABLET | Freq: Two times a day (BID) | ORAL | Status: DC
  Administered 2015-08-18 – 2015-08-19 (×2): 200 mg via ORAL
  Filled 2015-08-18 (×4): qty 1

## 2015-08-18 MED ORDER — AZITHROMYCIN 250 MG PO TABS
500.0000 mg | ORAL_TABLET | Freq: Every day | ORAL | Status: DC
  Filled 2015-08-18: qty 2

## 2015-08-18 MED ORDER — QUETIAPINE FUMARATE 25 MG PO TABS
25.0000 mg | ORAL_TABLET | Freq: Two times a day (BID) | ORAL | Status: DC
  Administered 2015-08-18 – 2015-08-23 (×11): 25 mg via ORAL
  Filled 2015-08-18 (×11): qty 1

## 2015-08-18 NOTE — Consult Note (Signed)
[facility] Consult   Reason for Consult:  AMS and intermittent hallucinations and alcoholic cirrhosis and sepsis Referring Physician:  Dr. Karleen Hampshire Patient Identification: Rhonda Fuentes MRN:  [id number] Principal Diagnosis: Altered mental status Diagnosis:   Patient Active Problem List   Diagnosis Date Noted  . Altered mental status [R41.82]   . Encephalopathy, hepatic ([place]) [K72.90] 08/12/2015  . Alcoholic cirrhosis ([place]) [L89.21] 08/12/2015  . Lactic acidosis [E87.2] 08/12/2015  . Hypothyroidism [E03.9] 08/12/2015  . GERD (gastroesophageal reflux disease) [K21.9] 08/12/2015  . Sepsis ([place]) [A41.9] 08/12/2015  . HCAP (healthcare-associated pneumonia) [J18.9]   . Pneumonia [J18.9] 08/02/2015  . Alcoholic hepatitis with ascites [K70.11] 07/15/2015    Total Time spent with patient: 1 hour  Subjective:   Rhonda Fuentes is a 50 y.o. female patient admitted with hallucinations, sepsis, alcohol cirrhosis and pneumonia.  HPI:  Rhonda Fuentes is a 50 y.o. Female, seen Chart reviewed and case discussed with staff RN and Dr. Karleen Hampshire Patient appeared lying on her bed and talking on the phone with her sister and asking questions like name of this provider and the speciality. Patient did not get off the phone even after introduced myself and stated she wanted her sister to be involved in the treatment and try to keep on the speaker phone without knowing there is a phone option is not available. When I try to help her with the speaker phone option I found that her sister was not on the phone at all. Patient stated she does not trust people in the hospital, the only people she can trust is her sister and her mother who were not in the hospital. Patient is also feels paranoid about people talking about her in her absence. Patient stated she does not know why she was in the hospital and she could not recall the name of the problem she came to the hospital etc.Patient seems to be highly confused, poor  historian and easily getting frustrated because she was not able to answer simple questions. Patient endorses drinking vodka but could not gave me the details or the pattern of drinking and Her last drink. Patient has a tangential and circumstantial thought process and continued to be talking extensively but not able to provide any's specific details of her Physical health problems, mental health Issues, and substance abuseproblems.    Medical history: Patient with PMH significant for alcoholic cirrhosis, hypothyroidism, chronic pain syndrome and recent admission due to PNA and encephalopathy (admitted to [facility]). Patient presented from GI office due to ongoing jaundice, confusion, low BP and difficulty breathing. In ED found to have abnormal CXR (with new infiltrates component) and features of sepsis. Patient endorses swelling of her legs and abdomen. She was hospitalized in [place] [facility] on 4/6-4/10 and 4/24-4/26. Her sister ilds Rhonda Fuentes thinks she is prematurely discharged from prior hospitalization,  Hallucination documented from prior hospitalization, she kept having hallucination here.  Past Psychiatric History: History of alcohol dependence, and has no [facility] admissions as per chart review.   Risk to Self: Is patient at risk for suicide?: No Risk to Others:   Prior Inpatient Therapy:   Prior Outpatient Therapy:    Past Medical History:  Past Medical History  Diagnosis Date  . Anemia   . Thyroid disease   . Alcohol abuse   . 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 Cholecystectomy    . Gastric bypass    . Ankle surgery  Family History:  Family History  Problem Relation Age of Onset  . Breast cancer Sister    Family Psychiatric  History: significant for alcohol abuse vs dependence. Social History:  History  Alcohol Use  . 12.0 oz/week  . 20 Standard drinks or equivalent per week     History  Drug Use  . Yes  . Special:  Marijuana    Social History   Social History  . Marital Status: Single    Spouse Name: N/A  . Number of Children: N/A  . Years of Education: N/A   Social History Main Topics  . Smoking status: Never Smoker   . Smokeless tobacco: Never Used  . Alcohol Use: 12.0 oz/week    20 Standard drinks or equivalent per week  . Drug Use: Yes    Special: Marijuana  . Sexual Activity: Not Currently   Other Topics Concern  . None   Social History Narrative   Additional Social History:    Allergies:  No Known Allergies  Labs:  Results for orders placed or performed during the hospital encounter of 08/12/15 (from the past 48 hour(s))  Protime-INR     Status: Abnormal   Collection Time: 08/17/15  5:11 AM  Result Value Ref Range   Prothrombin Time 22.1 (H) 11.6 - 15.2 seconds   INR 2.02 (H) 0.00 - 1.49  CBC     Status: Abnormal   Collection Time: 08/17/15  5:11 AM  Result Value Ref Range   WBC 16.0 (H) 4.0 - 10.5 K/uL   RBC 2.84 (L) 3.87 - 5.11 MIL/uL   Hemoglobin 10.7 (L) 12.0 - 15.0 g/dL   HCT 30.3 (L) 36.0 - 46.0 %   MCV 106.7 (H) 78.0 - 100.0 fL   MCH 37.7 (H) 26.0 - 34.0 pg   MCHC 35.3 30.0 - 36.0 g/dL   RDW 14.7 11.5 - 15.5 %   Platelets 103 (L) 150 - 400 K/uL    Comment: SPECIMEN CHECKED FOR CLOTS REPEATED TO VERIFY PLATELET COUNT CONFIRMED BY SMEAR   Comprehensive metabolic panel     Status: Abnormal   Collection Time: 08/17/15  5:11 AM  Result Value Ref Range   Sodium 139 135 - 145 mmol/L   Potassium 3.8 3.5 - 5.1 mmol/L   Chloride 113 (H) 101 - 111 mmol/L   CO2 18 (L) 22 - 32 mmol/L   Glucose, Bld 141 (H) 65 - 99 mg/dL   BUN 20 6 - 20 mg/dL   Creatinine, Ser 1.44 (H) 0.44 - 1.00 mg/dL   Calcium 8.4 (L) 8.9 - 10.3 mg/dL   Total Protein 5.1 (L) 6.5 - 8.1 g/dL   Albumin 2.2 (L) 3.5 - 5.0 g/dL   AST 166 (H) 15 - 41 U/L   ALT 45 14 - 54 U/L   Alkaline Phosphatase 110 38 - 126 U/L   Total Bilirubin 6.2 (H) 0.3 - 1.2 mg/dL   GFR calc non Af Amer 42 (L) >60 mL/min    GFR calc Af Amer 49 (L) >60 mL/min    Comment: (NOTE) The eGFR has been calculated using the CKD EPI equation. This calculation has not been validated in all clinical situations. eGFR's persistently <60 mL/min signify possible Chronic Kidney Disease.    Anion gap 8 5 - 15  Ammonia     Status: None   Collection Time: 08/17/15  5:11 AM  Result Value Ref Range   Ammonia 30 9 - 35 umol/L  Lactic acid, plasma  Status: None   Collection Time: 08/17/15  5:11 AM  Result Value Ref Range   Lactic Acid, Venous 1.7 0.5 - 2.0 mmol/L    Current Facility-Administered Medications  Medication Dose Route Frequency Provider Last Rate Last Dose  . ceFEPIme (MAXIPIME) 1 g in dextrose 5 % 50 mL IVPB  1 g Intravenous Q12H Christine E Shade, RPH   1 g at 08/17/15 1500  . Chlorhexidine Gluconate Cloth 2 % PADS 6 each  6 each Topical Q0600 Florencia Reasons, MD   6 each at 08/18/15 [phone]  . folic acid (FOLVITE) tablet 1 mg  1 mg Oral Daily Barton Dubois, MD   1 mg at 08/17/15 1050  . furosemide (LASIX) tablet 20 mg  20 mg Oral Daily Florencia Reasons, MD   20 mg at 08/17/15 1050  . guaiFENesin (MUCINEX) 12 hr tablet 600 mg  600 mg Oral BID Florencia Reasons, MD   600 mg at 08/17/15 2250  . lactulose (CHRONULAC) 10 GM/15ML solution 30 g  30 g Oral BID Florencia Reasons, MD   30 g at 08/17/15 2248  . levothyroxine (SYNTHROID, LEVOTHROID) tablet 50 mcg  50 mcg Oral QAC breakfast Barton Dubois, MD   50 mcg at 08/17/15 0815  . metoprolol tartrate (LOPRESSOR) tablet 12.5 mg  12.5 mg Oral BID Arnoldo Lenis, MD   12.5 mg at 08/17/15 2249  . mupirocin ointment (BACTROBAN) 2 %   Nasal BID Florencia Reasons, MD      . ondansetron [facility]) tablet 4 mg  4 mg Oral Q6H PRN Barton Dubois, MD       Or  . ondansetron [facility]) injection 4 mg  4 mg Intravenous Q6H PRN Barton Dubois, MD      . oxyCODONE (Oxy IR/ROXICODONE) immediate release tablet 5 mg  5 mg Oral Q8H PRN Barton Dubois, MD   5 mg at 08/18/15 0010  . pantoprazole (PROTONIX) EC tablet 40 mg  40 mg Oral BID  Barton Dubois, MD   40 mg at 08/17/15 2249  . [START ON 08/19/2015] predniSONE (DELTASONE) tablet 10 mg  10 mg Oral Q breakfast Florencia Reasons, MD      . predniSONE (DELTASONE) tablet 20 mg  20 mg Oral Q breakfast Florencia Reasons, MD      . rifaximin Doreene Nest) tablet 550 mg  550 mg Oral BID Florencia Reasons, MD   550 mg at 08/17/15 2250  . sodium bicarbonate tablet 650 mg  650 mg Oral BID Florencia Reasons, MD   650 mg at 08/17/15 2250  . sodium chloride flush (NS) 0.9 % injection 10-40 mL  10-40 mL Intracatheter Q12H Florencia Reasons, MD   10 mL at 08/17/15 1051  . sodium chloride flush (NS) 0.9 % injection 10-40 mL  10-40 mL Intracatheter PRN Florencia Reasons, MD   10 mL at 08/16/15 0336  . spironolactone (ALDACTONE) tablet 25 mg  25 mg Oral Daily Arnoldo Lenis, MD   25 mg at 08/17/15 1050  . thiamine (VITAMIN B-1) tablet 100 mg  100 mg Oral Daily Barton Dubois, MD   100 mg at 08/17/15 1050  . vancomycin (VANCOCIN) IVPB 750 mg/150 ml premix  750 mg Intravenous Q12H Randa Spike, RPH   750 mg at 08/17/15 1500  . zolpidem (AMBIEN) tablet 5 mg  5 mg Oral QHS PRN Dianne Dun, NP   5 mg at 08/18/15 0010    Musculoskeletal: Strength & Muscle Tone: decreased Gait & Station: unable to stand Patient leans: N/A  Psychiatric Specialty Exam: Review of Systems  Unable to perform ROS  Altered mental status, swollen legs and poor historian  Blood pressure 90/55, pulse 72, temperature 97.6 F (36.4 C), temperature source Oral, resp. rate 18, height 5' 8"  (1.727 m), weight 97.8 kg (215 lb 9.8 oz), last menstrual period 10/21/2014, SpO2 100 %.Body mass index is 32.79 kg/(m^2).  General Appearance: Bizarre and Guarded  Eye Contact::  Fair  Speech:  Blocked and Clear and Coherent  Volume:  Normal  Mood:  Anxious  Affect:  Inappropriate and Labile  Thought Process:  Irrelevant and Tangential  Orientation:  Negative  Thought Content:  Hallucinations: Visual, Paranoid Ideation and Rumination  Suicidal Thoughts:  No  Homicidal  Thoughts:  No  Memory:  Immediate;   Fair Recent;   Poor  Judgement:  Impaired  Insight:  Shallow  Psychomotor Activity:  Decreased  Concentration:  Fair  Recall:  Poor  Fund of Knowledge:Fair  Language: Fair  Akathisia:  No  Handed:  Right  AIMS (if indicated):     Assets:  Others:  deferred  ADL's:  Impaired  Cognition: Impaired,  Severe  Sleep:      Treatment Plan Summary: Patient appeared to be hepatic encephalopathic and a poor historian  Patient does not meet criteria for capacity to make her own medical decisions based on my evaluation today.  Refer to the unit social worker to contact family members regarding medical care power of attorney.  We start Seroquel 25 mg twice daily or as needed for hallucinations  Appreciate psychiatric consultation and we sign off as of today Please contact [phone] or [phone] if needs further assistance   Disposition: Patient does not meet criteria for psychiatric inpatient admission. Supportive therapy provided about ongoing stressors.  Durward Parcel., MD 08/18/2015 11:49 AM

## 2015-08-18 NOTE — Progress Notes (Signed)
PROGRESS NOTE  Rhonda Fuentes GEX:[id number] DOB: 1966-02-20 DOA: 08/12/2015 PCP: No PCP Per Patient  Brief Summary:  Rhonda Fuentes is a 50 y.o. female with PMH significant for alcoholic cirrhosis, hypothyroidism, chronic pain syndrome and recent admission due to PNA and encephalopathy (admitted to [facility]). Patient presented from GI office due to ongoing jaundice, confusion, low BP and difficulty breathing. In ED found to have abnormal CXR (with new infiltrates component) and features of sepsis. Patient endorses swelling of her legs and abdomen.   She was hospitalized in [place] [facility] on 4/6-4/10 and 4/24-4/26. Her sister  [name] thinks she is prematurely discharged from prior hospitalization,  Hallucination documented from prior hospitalization, she kept having hallucination here.   HPI/Recap of past 24 hours:  She was deemed non competant to make any decisions.   Assessment/Plan: Principal Problem:   Altered mental status Active Problems:   Alcoholic hepatitis with ascites   Pneumonia   Encephalopathy, hepatic (HCC)   Alcoholic cirrhosis (HCC)   Lactic acidosis   Hypothyroidism   GERD (gastroesophageal reflux disease)   Sepsis (HCC)   HCAP (healthcare-associated pneumonia)   Confusion  1-Sepsis (HCC): with concerns for HCAP  -patient recently admitted for PNA and hepatic encephalopathy -patient completed treatment according to instructions; but presented still with SOB, tachypnea, elevated WBC's and soft BP on admission. -patient found to have elevated lactic acid and CXR demonstrating new right lower lobe infiltrates. - sepsis protocol, admitted to stepdown;  lactic acid normalized,, moved to med/tele on 5/6 -Vanc and cefepime started from admission, then changed to vanc/rocephin/zithro, dropped zithro due to frequent NSVT. Then changed to oral doxycycline on 5/7, repeat CT chest with persistent multifocal pna, restarted vanc/ cefepime.  -poor iv access, patient pulled out  piccline, she will need repeat PICC line placement. Will call sis ter and get consent for PICC line placement.     2-Alcoholic hepatitis/cirrhosis with ascites: -negative hepatitis panel, ascites cytology no malignant cells reported, CEA/afp negative, family reported patient took lots of tylenol for pain control after ankle surgery a few months ago, tylenol level low, + family history of alcohol. -s/p paracentesis, paritoneal fluids analysis:  wbc 72, gram stain no organism seen , no ab pain, no sbp. -GI was consulted To assist with treatment/care of her cirrhosis  - get repeat ammonia level today.   3-confusion, hallucination, delirium, per chart review she has been having hallucination during last two hospitalization, confusion likely combination of alcohol withdrawal, wernicke's, infection, hepatic encephalopathy: -on lactulose, started rifaximin,  , but remain intermittent hallucination, confusion. Repeat ammonia level.  -ct head negative, psychiatry consulted for capacity eval, alcohol abuse, delirium, hallucination, family aware. She is deemed non competant to make decisions.    4-Hypothyroidism: -TSH 5.144 -on synthroid  5-GERD (gastroesophageal reflux disease): -continue PPI  6-chronic pain syndrome: - decrease/minimize narcotics currently given low BP and encephalopathy -has not required any pain meds in the hospital  7. INR elevated: likely from cirrhosis and sepsis, hold prophylaxis heparin, monitor INR  8. Bilateral lower extremity edema: likely from cirrhosis, venous US no DVT  9. Hypokalemia/hypomagnesemia: replace k/mag as needed.   10; frequent NSVT: echo adequate EF, no wall motion abnromalities, keep mag>2 and k>4, started betablocker. Not a candidate for amiodarone due to liver impairment. D/c zithromax. Correct mild acidosis with sodium bicarb tabs. Better, Cardiology consulted, recommedations given. .  11: cr elevated on 5/7, from vanc? Slightly  supratherapeutic, from diuretics? From hypotension? Hepatorenal? Monitor. vanc d/ced on 5/7, restarted vanc per  pharmacy dosing due to multifocal pna. Close monitor cr, renal dosing meds.  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  DVT prophylaxis: INR elevated on 5/6, d/c heparin Code Status: Full code Family Communication: none at bedside.  Disposition Plan: pending, not sure if safe to discharge home due to confusion, but patient passed PT eval.  I have discussed with care manager and patient's sister, will need [facility] if does not qualify for placement. Consults called:  [facility] for cirrhosis and related conditions Cardiology for NSVT   Procedures:  US guided paracentesis on 5/4  Antibiotics:  Vanc from admission to 5/7, restarted from 5/8  cefepime from admission to 5/6 , restarted on 5/8  Rocephin/zithro from 5/6 to 5/7  Doxycycline from 5/7 to 5/8.   Objective: BP 100/55 mmHg  Pulse 88  Temp(Src) 98 F (36.7 C) (Oral)  Resp 18  Ht  (1.727 m)  Wt 97.8 kg (215 lb 9.8 oz)  BMI 32.79 kg/m2  SpO2 100%  LMP 10/21/2014 (Approximate)  Intake/Output Summary (Last 24 hours) at 08/18/15 1756 Last data filed at 08/18/15 0900  Gross per 24 hour  Intake    120 ml  Output      0 ml  Net    120 ml   Filed Weights   08/16/15 0619 08/17/15 0630 08/18/15 0659  Weight: 98.1 kg (216 lb 4.3 oz) 97.9 kg (215 lb 13.3 oz) 97.8 kg (215 lb 9.8 oz)    Exam:   General:  Fail, slightly confused, + hallucination  Cardiovascular: RRR  Respiratory: improved aeration, no wheezing  Abdomen: less distended, but soft, NT, positive BS  Musculoskeletal:diffuse 3+bilateral lower extremity Edema  Neuro: slight confusion, + hallucination  Data Reviewed: Basic Metabolic Panel:  Recent Labs Lab 08/12/15 2053 08/13/15 1755 08/14/15 0200 08/14/15 1400 08/15/15 0310 08/16/15 0336 08/17/15 0511  NA  --  137 135 138 136 137 139  K  --  3.2* 3.5  3.2* 4.3 4.1 3.8  CL  --  109 110 114* 110 111 113*  CO2  --  18* 18* 15* 17* 17* 18*  GLUCOSE  --  128* 159* 154* 164* 135* 141*  BUN  --  CREATININE 0.88 0.92 0.95 1.00 1.15* 1.27* 1.44*  CALCIUM  --  7.3* 7.1* 6.7* 7.7* 8.0* 8.4*  MG 1.4* 1.3* 1.8 2.1 2.2 2.2  --   PHOS 3.6  --   --   --   --   --   --    Liver Function Tests:  Recent Labs Lab 08/12/15 1408 08/13/15 1755 08/14/15 0200 08/16/15 0336 08/17/15 0511  AST 144* 122* 111* 113* 166*  ALT 38 32 27 34 45  ALKPHOS 131* 111 104 115 110  BILITOT 8.9* 7.0* 6.8* 6.1* 6.2*  PROT 6.0* 4.8* 4.7* 5.3* 5.1*  ALBUMIN 2.3* 1.8* 1.7* 2.0* 2.2*   No results for input(s): LIPASE, AMYLASE in the last 168 hours.  Recent Labs Lab 08/12/15 1408 08/15/15 0310 08/16/15 0336 08/17/15 0511  AMMONIA 47* 49* 40* 30   CBC:  Recent Labs Lab 08/12/15 1408 08/14/15 0200 08/15/15 0310 08/16/15 0336 08/17/15 0511  WBC 12.4* 11.0* 17.4* 18.3* 16.0*  NEUTROABS 10.6*  --   --   --   --   HGB 11.0* 9.4* 10.4* 10.2* 10.7*  HCT 31.3* 26.4* 29.4* 29.5* 30.3*  MCV 108.7* 107.3* 106.1* 105.4* 106.7*  PLT 236 225 254 172 103*   Cardiac Enzymes:  No results for input(s): CKTOTAL, CKMB, CKMBINDEX, TROPONINI in the last 168 hours. BNP (last 3 results) No results for input(s): BNP in the last 8760 hours.  ProBNP (last 3 results) No results for input(s): PROBNP in the last 8760 hours.  CBG:  Recent Labs Lab 08/12/15 1315  GLUCAP 71    Recent Results (from the past 240 hour(s))  Culture, blood (Routine X 2) w Reflex to ID Panel     Status: None   Collection Time: 08/12/15  1:38 PM  Result Value Ref Range Status   Specimen Description BLOOD RIGHT ANTECUBITAL  Final   Special Requests IN PEDIATRIC BOTTLE 5CC  Final   Culture   Final    NO GROWTH 5 DAYS [facility]    Report Status 08/17/2015 FINAL  Final  Culture, blood (Routine X 2) w Reflex to ID Panel     Status: None   Collection  Time: 08/12/15  2:08 PM  Result Value Ref Range Status   Specimen Description BLOOD LEFT ANTECUBITAL  Final   Special Requests IN PEDIATRIC BOTTLE 2 CC  Final   Culture   Final    NO GROWTH 5 DAYS Performed at [facility]    Report Status 08/17/2015 FINAL  Final  MRSA PCR Screening     Status: Abnormal   Collection Time: 08/12/15  4:45 PM  Result Value Ref Range Status   MRSA by PCR POSITIVE (A) NEGATIVE Final    Comment:        The GeneXpert MRSA Assay (FDA approved for NASAL specimens only), is one component of a comprehensive MRSA colonization surveillance program. It is not intended to diagnose MRSA infection nor to guide or monitor treatment for MRSA infections. RESULT CALLED TO, READ BACK BY AND VERIFIED WITH: CAROLYN CREECH,RN [id number] @ 1805 BY J SCOTTON   Urine culture     Status: Abnormal   Collection Time: 08/12/15  9:23 PM  Result Value Ref Range Status   Specimen Description URINE, CLEAN CATCH  Final   Special Requests NONE  Final   Culture (A)  Final    1,000 COLONIES/mL INSIGNIFICANT GROWTH Performed at [facility]    Report Status 08/14/2015 FINAL  Final  Culture, body fluid-bottle     Status: None   Collection Time: 08/13/15 10:07 AM  Result Value Ref Range Status   Specimen Description FLUID PERITONEAL  Final   Special Requests BOTTLES DRAWN AEROBIC AND ANAEROBIC 10CC  Final   Culture   Final    NO GROWTH 5 DAYS Performed at [facility]    Report Status 08/18/2015 FINAL  Final  Gram stain     Status: None   Collection Time: 08/13/15 10:07 AM  Result Value Ref Range Status   Specimen Description FLUID PERITONEAL  Final   Special Requests NONE  Final   Gram Stain   Final    FEW WBC PRESENT, PREDOMINANTLY MONONUCLEAR NO ORGANISMS SEEN Performed at [facility]    Report Status 08/13/2015 FINAL  Final     Studies: Ct Head Wo Contrast  08/17/2015  CLINICAL DATA:  50 year old female with confusion EXAM: CT HEAD  WITHOUT CONTRAST TECHNIQUE: Contiguous axial images were obtained from the base of the skull through the vertex without intravenous contrast. COMPARISON:  None. FINDINGS: The ventricles and the sulci are appropriate in size for the patient's age. There is no intracranial hemorrhage. No midline shift or mass effect identified. The gray-white matter differentiation is  preserved. The visualized paranasal sinuses and mastoid air cells are well aerated. The calvarium is intact. IMPRESSION: No acute intracranial pathology. Electronically Signed   By: Elgie Collard M.D.   On: 08/17/2015 23:55    Scheduled Meds: . azithromycin  500 mg Oral QHS  . cefpodoxime  200 mg Oral Q12H  . Chlorhexidine Gluconate Cloth  6 each Topical Q0600  . folic acid  1 mg Oral Daily  . furosemide  20 mg Oral Daily  . guaiFENesin  600 mg Oral BID  . lactulose  30 g Oral BID  . levothyroxine  50 mcg Oral QAC breakfast  . metoprolol tartrate  12.5 mg Oral BID  . mupirocin ointment   Nasal BID  . pantoprazole  40 mg Oral BID  . [START ON 08/19/2015] predniSONE  10 mg Oral Q breakfast  . QUEtiapine  25 mg Oral BID  . rifaximin  550 mg Oral BID  . sodium bicarbonate  650 mg Oral BID  . sodium chloride flush  10-40 mL Intracatheter Q12H  . spironolactone  25 mg Oral Daily  . thiamine  100 mg Oral Daily    Continuous Infusions:     Time spent:  Chesnie Capell MD, PhD  [facility] Pager [phone] If 7PM-7AM, please contact night-coverage at [email or web address], password [facility] 08/18/2015, 5:56 PM  LOS: 6 days

## 2015-08-18 NOTE — Progress Notes (Signed)
[facility] Progress Note  Subjective: Seems confused but in no acute distress.  Objective: Vital signs in last 24 hours: Temp:  [97.6 F (36.4 C)-98.2 F (36.8 C)] 97.6 F (36.4 C) (05/10 0659) Pulse Rate:  [70-84] 72 (05/10 0659) Resp:  [18-20] 18 (05/10 0659) BP: (90-101)/(55-60) 90/55 mmHg (05/10 0659) SpO2:  [98 %-100 %] 100 % (05/10 0659) Weight:  [97.8 kg (215 lb 9.8 oz)] 97.8 kg (215 lb 9.8 oz) (05/10 0659) Weight change: -0.1 kg (-3.5 oz)   PE:  No distress  Heart regular rhythm  Lungs clear  Abdomen nontender  CT of head was done with no acute findings  Lab Results: No results found for this or any previous visit (from the past 24 hour(s)).  Studies/Results: Ct Head Wo Contrast  08/17/2015  CLINICAL DATA:  50 year old female with confusion EXAM: CT HEAD WITHOUT CONTRAST TECHNIQUE: Contiguous axial images were obtained from the base of the skull through the vertex without intravenous contrast. COMPARISON:  None. FINDINGS: The ventricles and the sulci are appropriate in size for the patient's age. There is no intracranial hemorrhage. No midline shift or mass effect identified. The gray-white matter differentiation is preserved. The visualized paranasal sinuses and mastoid air cells are well aerated. The calvarium is intact. IMPRESSION: No acute intracranial pathology. Electronically Signed   By: Elgie CollardArash  Radparvar M.D.   On: 08/17/2015 23:55      Assessment: Alcoholic cirrhosis of the liver with portal hypertension and ascites and hepatic encephalopathy  Pneumonia  Plan:   Continue current medical management    Gwenevere AbbotSAM F GANEM 08/18/2015, 9:12 AM  Pager: [phone] If no answer or after 5 PM call [phone]

## 2015-08-19 LAB — CBC
HCT: 30.9 % — ABNORMAL LOW (ref 36.0–46.0)
Hemoglobin: 10.5 g/dL — ABNORMAL LOW (ref 12.0–15.0)
MCH: 36.7 pg — AB (ref 26.0–34.0)
MCHC: 34 g/dL (ref 30.0–36.0)
MCV: 108 fL — AB (ref 78.0–100.0)
PLATELETS: 64 10*3/uL — AB (ref 150–400)
RBC: 2.86 MIL/uL — ABNORMAL LOW (ref 3.87–5.11)
RDW: 14.8 % (ref 11.5–15.5)
WBC: 10.9 10*3/uL — AB (ref 4.0–10.5)

## 2015-08-19 LAB — BASIC METABOLIC PANEL
ANION GAP: 8 (ref 5–15)
BUN: 22 mg/dL — AB (ref 6–20)
CALCIUM: 8.3 mg/dL — AB (ref 8.9–10.3)
CO2: 20 mmol/L — ABNORMAL LOW (ref 22–32)
Chloride: 115 mmol/L — ABNORMAL HIGH (ref 101–111)
Creatinine, Ser: 1.57 mg/dL — ABNORMAL HIGH (ref 0.44–1.00)
GFR calc Af Amer: 44 mL/min — ABNORMAL LOW (ref >60)
GFR, EST NON AFRICAN AMERICAN: 38 mL/min — AB (ref >60)
GLUCOSE: 123 mg/dL — AB (ref 65–99)
Potassium: 3.6 mmol/L (ref 3.5–5.1)
SODIUM: 143 mmol/L (ref 135–145)

## 2015-08-19 LAB — OCCULT BLOOD X 1 CARD TO LAB, STOOL: Fecal Occult Bld: NEGATIVE

## 2015-08-19 LAB — PROTIME-INR
INR: 1.82 — ABNORMAL HIGH (ref 0.00–1.49)
PROTHROMBIN TIME: 21.1 s — AB (ref 11.6–15.2)

## 2015-08-19 LAB — AMMONIA: Ammonia: 18 umol/L (ref 9–35)

## 2015-08-19 MED ORDER — SODIUM CHLORIDE 0.9% FLUSH
10.0000 mL | INTRAVENOUS | Status: DC | PRN
Start: 2015-08-19 — End: 2015-08-27
  Administered 2015-08-26: 10 mL
  Filled 2015-08-19: qty 40

## 2015-08-19 MED ORDER — FUROSEMIDE 10 MG/ML IJ SOLN
40.0000 mg | Freq: Every day | INTRAMUSCULAR | Status: DC
  Administered 2015-08-19 – 2015-08-20 (×2): 40 mg via INTRAVENOUS
  Filled 2015-08-19 (×2): qty 4

## 2015-08-19 MED ORDER — DEXTROSE 5 % IV SOLN
1.0000 g | Freq: Once | INTRAVENOUS | Status: AC
  Administered 2015-08-19: 1 g via INTRAVENOUS
  Filled 2015-08-19: qty 1

## 2015-08-19 MED ORDER — DOXYCYCLINE HYCLATE 100 MG PO TABS
100.0000 mg | ORAL_TABLET | Freq: Two times a day (BID) | ORAL | Status: DC
  Administered 2015-08-19: 100 mg via ORAL
  Filled 2015-08-19: qty 1

## 2015-08-19 MED ORDER — VANCOMYCIN HCL IN DEXTROSE 750-5 MG/150ML-% IV SOLN
750.0000 mg | Freq: Once | INTRAVENOUS | Status: AC
  Administered 2015-08-19: 750 mg via INTRAVENOUS
  Filled 2015-08-19: qty 150

## 2015-08-19 MED ORDER — VANCOMYCIN HCL IN DEXTROSE 750-5 MG/150ML-% IV SOLN
750.0000 mg | Freq: Two times a day (BID) | INTRAVENOUS | Status: DC
  Administered 2015-08-20 – 2015-08-21 (×3): 750 mg via INTRAVENOUS
  Filled 2015-08-19 (×4): qty 150

## 2015-08-19 MED ORDER — DEXTROSE 5 % IV SOLN
1.0000 g | Freq: Two times a day (BID) | INTRAVENOUS | Status: DC
  Administered 2015-08-20 – 2015-08-24 (×9): 1 g via INTRAVENOUS
  Filled 2015-08-19 (×10): qty 1

## 2015-08-19 NOTE — Progress Notes (Signed)
Verbal consent from sister Felicity CoyerJulie Oldd received. Second nurse Ify, to verify. Consent in chart.

## 2015-08-19 NOTE — Progress Notes (Signed)
Spoke with Boneta LucksJenny RN regarding PICC placement.  Pt hx of refusal and pulling PICC line out.  RN to clarify with MD if still need PICC and if/when family gives consent.

## 2015-08-19 NOTE — Progress Notes (Signed)
PROGRESS NOTE  Rhonda Fuentes ZOX:[id number] DOB: 1965/08/27 DOA: 08/12/2015 PCP: No PCP Per Patient  Brief Summary:  Rhonda Fuentes is a 50 y.o. female with PMH significant for alcoholic cirrhosis, hypothyroidism, chronic pain syndrome and recent admission due to PNA and encephalopathy (admitted to [facility]). Patient presented from GI office due to ongoing jaundice, confusion, low BP and difficulty breathing. In ED found to have abnormal CXR (with new infiltrates component) and features of sepsis. Patient endorses swelling of her legs and abdomen.   She was hospitalized in [place] [facility] on 4/6-4/10 and 4/24-4/26. Her sister  Rhonda Fuentes thinks she is prematurely discharged from prior hospitalization,   5/11 patient is more alert and oriented to place and person.  Her hallucinations have improved.  She is more pleasant and not angry.    HPI/Recap of past 24 hours:  She agreed for the PICC line and it was placed without any problem.   Assessment/Plan: Principal Problem:   Altered mental status Active Problems:   Alcoholic hepatitis with ascites   Pneumonia   Encephalopathy, hepatic (HCC)   Alcoholic cirrhosis (HCC)   Lactic acidosis   Hypothyroidism   GERD (gastroesophageal reflux disease)   Sepsis (HCC)   HCAP (healthcare-associated pneumonia)   Confusion  1-Sepsis (HCC): with concerns for HCAP  -patient recently admitted for PNA and hepatic encephalopathy -patient completed treatment according to instructions; but presented still with SOB, tachypnea, elevated WBC's and soft BP on admission. -patient found to have elevated lactic acid and CXR demonstrating new right lower lobe infiltrates. - sepsis protocol, admitted to stepdown;  lactic acid normalized,, moved to med/tele on 5/6 -Vanc and cefepime started from admission, then changed to vanc/rocephin/zithro, dropped zithro due to frequent NSVT. Then changed to oral doxycycline on 5/7, repeat CT chest with persistent multifocal pna,  restarted vanc/ cefepime.  -poor iv access, patient pulled out piccline, she will need repeat PICC line placement. Which was placed today and restarted her IV antibiotics.     2-Alcoholic hepatitis/cirrhosis with ascites: -negative hepatitis panel, ascites cytology no malignant cells reported, CEA/afp negative, family reported patient took lots of tylenol for pain control after ankle surgery a few months ago, tylenol level low, + family history of alcohol. -s/p paracentesis, paritoneal fluids analysis:  wbc 72, gram stain no organism seen , no ab pain, no sbp. -GI was consulted To assist with treatment/care of her cirrhosis  - repeat ammonia level is normal.   3-confusion, hallucination, delirium, per chart review she has been having hallucination during last two hospitalization, confusion likely combination of alcohol withdrawal, wernicke's, infection, hepatic encephalopathy: -on lactulose, started rifaximin,  Her confusion has improved.  -ct head negative, psychiatry consulted for capacity eval, alcohol abuse, delirium, hallucination, family aware. She is deemed non competant to make decisions.  Discussed with the patient and her sister Rhonda Fuentes.   4-Hypothyroidism: -TSH 5.144 -on synthroid  5-GERD (gastroesophageal reflux disease): -continue PPI  6-chronic pain syndrome: - decrease/minimize narcotics currently given low BP and encephalopathy -has not required any pain meds in the hospital  7. INR elevated: likely from cirrhosis and sepsis, hold prophylaxis heparin, monitor INR  8. Bilateral lower extremity edema: likely from cirrhosis, venous US no DVT  9. Hypokalemia/hypomagnesemia: replace k/mag as needed.   10; frequent NSVT: echo adequate EF, no wall motion abnromalities, keep mag>2 and k>4, started betablocker. Not a candidate for amiodarone due to liver impairment. D/c zithromax. Correct mild acidosis with sodium bicarb tabs. Better, Cardiology consulted, recommedations  given. .  11:  cr elevated on 5/7, from vanc? Slightly supratherapeutic, from diuretics? From hypotension? Hepatorenal? Monitor. Her creatinine increased to 1.57, if it continues to increase, will get UA, and US renal.  will probably need further diuresis, as she appears fluid overloaded.   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  DVT prophylaxis: scd's Code Status: Full code Family Communication: none at bedside.  Disposition Plan: pending PT consult. Discussed with sister Rhonda Fuentes who is in [place].  Consults called:  [facility] for cirrhosis and related conditions Cardiology for NSVT   Procedures:  US guided paracentesis on 5/4  Antibiotics:  Vanc from admission to 5/7, restarted from 5/8  cefepime from admission to 5/6 , restarted on 5/8  Rocephin/zithro from 5/6 to 5/7  Doxycycline from 5/7 to 5/8.   Objective: BP 112/52 mmHg  Pulse 82  Temp(Src) 97.7 F (36.5 C) (Oral)  Resp 20  Ht 5\' 8"  (1.727 m)  Wt 96.3 kg (212 lb 4.9 oz)  BMI 32.29 kg/m2  SpO2 98%  LMP 10/21/2014 (Approximate)  Intake/Output Summary (Last 24 hours) at 08/19/15 1758 Last data filed at 08/19/15 0640  Gross per 24 hour  Intake    240 ml  Output      0 ml  Net    240 ml   Filed Weights   08/17/15 0630 08/18/15 0659 08/19/15 0515  Weight: 97.9 kg (215 lb 13.3 oz) 97.8 kg (215 lb 9.8 oz) 96.3 kg (212 lb 4.9 oz)    Exam:   General:  Fail, slightly confused, no hallucinations today.   Cardiovascular: RRR  Respiratory: improved aeration, no wheezing  Abdomen: less distended, but soft, NT, positive BS  Musculoskeletal:diffuse 3+bilateral lower extremity Edema  Neuro: slight confusion,  Data Reviewed: Basic Metabolic Panel:  Recent Labs Lab 08/12/15 2053  08/13/15 1755 08/14/15 0200 08/14/15 1400 08/15/15 0310 08/16/15 0336 08/17/15 0511 08/19/15 0416  NA  --   < > 137 135 138 136 137 139 143  K  --   < > 3.2* 3.5 3.2* 4.3 4.1 3.8 3.6  CL  --   < > 109 110  114* 110 111 113* 115*  CO2  --   < > 18* 18* 15* 17* 17* 18* 20*  GLUCOSE  --   < > 128* 159* 154* 164* 135* 141* 123*  BUN  --   < > 9 10 10 13 17 20  22*  CREATININE 0.88  --  0.92 0.95 1.00 1.15* 1.27* 1.44* 1.57*  CALCIUM  --   < > 7.3* 7.1* 6.7* 7.7* 8.0* 8.4* 8.3*  MG 1.4*  --  1.3* 1.8 2.1 2.2 2.2  --   --   PHOS 3.6  --   --   --   --   --   --   --   --   < > = values in this interval not displayed. Liver Function Tests:  Recent Labs Lab 08/13/15 1755 08/14/15 0200 08/16/15 0336 08/17/15 0511  AST 122* 111* 113* 166*  ALT 32 27 34 45  ALKPHOS 111 104 115 110  BILITOT 7.0* 6.8* 6.1* 6.2*  PROT 4.8* 4.7* 5.3* 5.1*  ALBUMIN 1.8* 1.7* 2.0* 2.2*   No results for input(s): LIPASE, AMYLASE in the last 168 hours.  Recent Labs Lab 08/15/15 0310 08/16/15 0336 08/17/15 0511 08/19/15 0416  AMMONIA 49* 40* 30 18   CBC:  Recent Labs Lab 08/14/15 0200 08/15/15 0310 08/16/15 0336 08/17/15 0511 08/19/15 0416  WBC 11.0* 17.4* 18.3* 16.0* 10.9*  HGB 9.4* 10.4* 10.2* 10.7* 10.5*  HCT 26.4* 29.4* 29.5* 30.3* 30.9*  MCV 107.3* 106.1* 105.4* 106.7* 108.0*  PLT 225 254 172 103* 64*   Cardiac Enzymes:   No results for input(s): CKTOTAL, CKMB, CKMBINDEX, TROPONINI in the last 168 hours. BNP (last 3 results) No results for input(s): BNP in the last 8760 hours.  ProBNP (last 3 results) No results for input(s): PROBNP in the last 8760 hours.  CBG: No results for input(s): GLUCAP in the last 168 hours.  Recent Results (from the past 240 hour(s))  Culture, blood (Routine X 2) w Reflex to ID Panel     Status: None   Collection Time: 08/12/15  1:38 PM  Result Value Ref Range Status   Specimen Description BLOOD RIGHT ANTECUBITAL  Final   Special Requests IN PEDIATRIC BOTTLE 5CC  Final   Culture   Final    NO GROWTH 5 DAYS Performed at [facility]    Report Status 08/17/2015 FINAL  Final  Culture, blood (Routine X 2) w Reflex to ID Panel     Status: None    Collection Time: 08/12/15  2:08 PM  Result Value Ref Range Status   Specimen Description BLOOD LEFT ANTECUBITAL  Final   Special Requests IN PEDIATRIC BOTTLE 2 CC  Final   Culture   Final    NO GROWTH 5 DAYS Performed at [facility]    Report Status 08/17/2015 FINAL  Final  MRSA PCR Screening     Status: Abnormal   Collection Time: 08/12/15  4:45 PM  Result Value Ref Range Status   MRSA by PCR POSITIVE (A) NEGATIVE Final    Comment:        The GeneXpert MRSA Assay (FDA approved for NASAL specimens only), is one component of a comprehensive MRSA colonization surveillance program. It is not intended to diagnose MRSA infection nor to guide or monitor treatment for MRSA infections. RESULT CALLED TO, READ BACK BY AND VERIFIED WITH: CAROLYN CREECH,RN [id number] @ 1805 BY J SCOTTON   Urine culture     Status: Abnormal   Collection Time: 08/12/15  9:23 PM  Result Value Ref Range Status   Specimen Description URINE, CLEAN CATCH  Final   Special Requests NONE  Final   Culture (A)  Final    1,000 COLONIES/mL INSIGNIFICANT GROWTH Performed at [facility]    Report Status 08/14/2015 FINAL  Final  Culture, body fluid-bottle     Status: None   Collection Time: 08/13/15 10:07 AM  Result Value Ref Range Status   Specimen Description FLUID PERITONEAL  Final   Special Requests BOTTLES DRAWN AEROBIC AND ANAEROBIC 10CC  Final   Culture   Final    NO GROWTH 5 DAYS Performed at [facility]    Report Status 08/18/2015 FINAL  Final  Gram stain     Status: None   Collection Time: 08/13/15 10:07 AM  Result Value Ref Range Status   Specimen Description FLUID PERITONEAL  Final   Special Requests NONE  Final   Gram Stain   Final    FEW WBC PRESENT, PREDOMINANTLY MONONUCLEAR NO ORGANISMS SEEN Performed at [facility]    Report Status 08/13/2015 FINAL  Final     Studies: No results found.  Scheduled Meds: . [START ON 08/20/2015] ceFEPime (MAXIPIME) IV  1 g  Intravenous Q12H  . Chlorhexidine Gluconate Cloth  6 each Topical Q0600  . folic acid  1 mg Oral Daily  .  furosemide  40 mg Intravenous Daily  . guaiFENesin  600 mg Oral BID  . lactulose  30 g Oral BID  . levothyroxine  50 mcg Oral QAC breakfast  . metoprolol tartrate  12.5 mg Oral BID  . mupirocin ointment   Nasal BID  . pantoprazole  40 mg Oral BID  . QUEtiapine  25 mg Oral BID  . rifaximin  550 mg Oral BID  . sodium bicarbonate  650 mg Oral BID  . sodium chloride flush  10-40 mL Intracatheter Q12H  . spironolactone  25 mg Oral Daily  . thiamine  100 mg Oral Daily  . [START ON 08/20/2015] vancomycin  750 mg Intravenous Q12H    Continuous Infusions:     Time spent: 35mins  Josha Weekley MD, PhD  [facility] Pager [phone] If 7PM-7AM, please contact night-coverage at [email or web address], password [email or web address] 08/19/2015, 5:58 PM  LOS: 7 days

## 2015-08-19 NOTE — Progress Notes (Signed)
[facility] Progress Note  Subjective: She has no complaints today. She is lying in bed watching TV.  Objective: Vital signs in last 24 hours: Temp:  [97.5 F (36.4 C)-98 F (36.7 C)] 97.7 F (36.5 C) (05/11 0515) Pulse Rate:  [79-103] 79 (05/11 0515) Resp:  [18-20] 20 (05/11 0515) BP: (98-109)/(55-60) 102/56 mmHg (05/11 0515) SpO2:  [96 %-100 %] 96 % (05/11 0515) Weight:  [96.3 kg (212 lb 4.9 oz)] 96.3 kg (212 lb 4.9 oz) (05/11 0515) Weight change: -1.5 kg (-3 lb 4.9 oz)   PE:  No acute distress    Lab Results: Results for orders placed or performed during the hospital encounter of 08/12/15 (from the past 24 hour(s))  Protime-INR     Status: Abnormal   Collection Time: 08/19/15  4:16 AM  Result Value Ref Range   Prothrombin Time 21.1 (H) 11.6 - 15.2 seconds   INR 1.82 (H) 0.00 - 1.49  CBC     Status: Abnormal   Collection Time: 08/19/15  4:16 AM  Result Value Ref Range   WBC 10.9 (H) 4.0 - 10.5 K/uL   RBC 2.86 (L) 3.87 - 5.11 MIL/uL   Hemoglobin 10.5 (L) 12.0 - 15.0 g/dL   HCT 16.130.9 (L) 09.636.0 - 04.546.0 %   MCV 108.0 (H) 78.0 - 100.0 fL   MCH 36.7 (H) 26.0 - 34.0 pg   MCHC 34.0 30.0 - 36.0 g/dL   RDW 40.914.8 81.111.5 - 91.415.5 %   Platelets 64 (L) 150 - 400 K/uL  Basic metabolic panel     Status: Abnormal   Collection Time: 08/19/15  4:16 AM  Result Value Ref Range   Sodium 143 135 - 145 mmol/L   Potassium 3.6 3.5 - 5.1 mmol/L   Chloride 115 (H) 101 - 111 mmol/L   CO2 20 (L) 22 - 32 mmol/L   Glucose, Bld 123 (H) 65 - 99 mg/dL   BUN 22 (H) 6 - 20 mg/dL   Creatinine, Ser 7.821.57 (H) 0.44 - 1.00 mg/dL   Calcium 8.3 (L) 8.9 - 10.3 mg/dL   GFR calc non Af Amer 38 (L) >60 mL/min   GFR calc Af Amer 44 (L) >60 mL/min   Anion gap 8 5 - 15  Ammonia     Status: None   Collection Time: 08/19/15  4:16 AM  Result Value Ref Range   Ammonia 18 9 - 35 umol/L    Studies/Results: No results found.    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

## 2015-08-19 NOTE — Progress Notes (Signed)
Peripherally Inserted Central Catheter/Midline Placement  The IV Nurse has discussed with the patient and/or persons authorized to consent for the patient, the purpose of this procedure and the potential benefits and risks involved with this procedure.  The benefits include less needle sticks, lab draws from the catheter and patient may be discharged home with the catheter.  Risks include, but not limited to, infection, bleeding, blood clot (thrombus formation), and puncture of an artery; nerve damage and irregular heat beat.  Alternatives to this procedure were also discussed.  PICC/Midline Placement Documentation        Rhonda DevoidGibbs, Rhonda Fuentes Rhonda Fuentes 08/19/2015, 11:22 AM Consent obtained from sister by staff

## 2015-08-19 NOTE — Progress Notes (Signed)
Pharmacy Antibiotic Note  Rhonda Fuentes is a 50 y.o. female admitted on 08/12/2015 with Rhonda Bitterrinia Kimbrough is a 50 y.o. female admitted on 08/12/2015 with PNA and hepatic encephalopathy. Vanc and cefepime started from admission, but narrowed to oral doxycycline on 5/7. Repeat CT chest with persistent multifocal pna, and MD will broaden abx to vanc/ cefepime per pharmacy dosing.  5/11 Noted that pt pulled out PICC line on 5/8 PM and has been unable to get IV medications since that time.  She was started on PO antibiotics on 5/10. A new PICC line has been placed today, 5/11 and Pharmacy is consulted to resume Vancomycin and Cefepime dosing for pneumonia. SCr continues to increase to 1.57 with CrCl ~ 52 ml/min CG, ~ 49N Afebrile, WBC elevated but improving.  Plan: Cefepime 1g IV q12h Vancomycin 750 mg IV q12h. Measure Vanc trough at steady state. Follow up renal fxn, culture results, and clinical course.   Height: 5\' 8"  (172.7 cm) Weight: 212 lb 4.9 oz (96.3 kg) IBW/kg (Calculated) : 63.9  Temp (24hrs), Avg:97.7 F (36.5 C), Min:97.5 F (36.4 C), Max:98 F (36.7 C)   Recent Labs Lab 08/12/15 2054 08/13/15 1755 08/13/15 1930 08/13/15 1952 08/14/15 0120 08/14/15 0200 08/14/15 1400 08/14/15 1535 08/15/15 0310 08/16/15 0336 08/17/15 0511 08/19/15 0416  WBC  --   --   --   --   --  11.0*  --   --  17.4* 18.3* 16.0* 10.9*  CREATININE  --  0.92  --   --   --  0.95 1.00  --  1.15* 1.27* 1.44* 1.57*  LATICACIDVEN 3.0* 2.1*  --  1.7 1.2  --   --   --   --   --  1.7  --   VANCOTROUGH  --   --  32*  --   --   --   --  24*  --   --   --   --     Estimated Creatinine Clearance: 52.6 mL/min (by C-G formula based on Cr of 1.57).    No Known Allergies  Antimicrobials this admission: 5/4 Zosyn x1 5/4 Vancomycin >> 5/7, resumed 5/8 >>5/8, resumed 5/11 >> 5/4 Cefepime >> 5/6, resumed 5/8 >>5/8, resumed 5/11 >> 5/6 Ceftriaxone >> 5/7 5/6 Azithromycin >> 5/6 5/7 Doxy >> 5/8, resumed 5/11  >>5/11 5/10 Cefpodoxime >> 5/11   Levels/Dose adjustments this admission: 5/5 VT = canceled; NOT trough, disregard this level 5/6 1530 VT = 24 on 1g q8, adjusting to 1250 mg IV q12h (SCr 0.95)  Microbiology results: 5/4 BCx: NGF 5/4 MRSA PCR: positive 5/4 Urine: 1K colonies, insignificant growth 5/4: Strep Pneumo: neg 5/5: Peritoneal fluid: NGF  Thank you for allowing pharmacy to be a part of this patient's care.  Lynann Beaverhristine Madalyne Husk PharmD, BCPS Pager [phone] 08/19/2015 12:58 PM

## 2015-08-20 LAB — CBC
HCT: 30 % — ABNORMAL LOW (ref 36.0–46.0)
Hemoglobin: 10.5 g/dL — ABNORMAL LOW (ref 12.0–15.0)
MCH: 37.4 pg — ABNORMAL HIGH (ref 26.0–34.0)
MCHC: 35 g/dL (ref 30.0–36.0)
MCV: 106.8 fL — AB (ref 78.0–100.0)
PLATELETS: 138 10*3/uL — AB (ref 150–400)
RBC: 2.81 MIL/uL — AB (ref 3.87–5.11)
RDW: 14.9 % (ref 11.5–15.5)
WBC: 13.2 10*3/uL — AB (ref 4.0–10.5)

## 2015-08-20 LAB — BASIC METABOLIC PANEL
ANION GAP: 9 (ref 5–15)
BUN: 22 mg/dL — ABNORMAL HIGH (ref 6–20)
CALCIUM: 8.2 mg/dL — AB (ref 8.9–10.3)
CO2: 19 mmol/L — ABNORMAL LOW (ref 22–32)
Chloride: 113 mmol/L — ABNORMAL HIGH (ref 101–111)
Creatinine, Ser: 1.45 mg/dL — ABNORMAL HIGH (ref 0.44–1.00)
GFR, EST AFRICAN AMERICAN: 48 mL/min — AB (ref >60)
GFR, EST NON AFRICAN AMERICAN: 42 mL/min — AB (ref >60)
Glucose, Bld: 117 mg/dL — ABNORMAL HIGH (ref 65–99)
POTASSIUM: 3.6 mmol/L (ref 3.5–5.1)
SODIUM: 141 mmol/L (ref 135–145)

## 2015-08-20 LAB — PROTIME-INR
INR: 1.87 — AB (ref 0.00–1.49)
PROTHROMBIN TIME: 21.5 s — AB (ref 11.6–15.2)

## 2015-08-20 MED ORDER — FUROSEMIDE 10 MG/ML IJ SOLN
40.0000 mg | Freq: Two times a day (BID) | INTRAMUSCULAR | Status: DC
  Administered 2015-08-20 – 2015-08-24 (×8): 40 mg via INTRAVENOUS
  Filled 2015-08-20 (×8): qty 4

## 2015-08-20 NOTE — Progress Notes (Signed)
[facility] Progress Note  Subjective: The patient has no complaints today. She is sitting up in bed. Seems very pleasant today.  Objective: Vital signs in last 24 hours: Temp:  [97.6 F (36.4 C)-98.1 F (36.7 C)] 97.6 F (36.4 C) (05/12 0513) Pulse Rate:  [67-82] 79 (05/12 1237) Resp:  [20] 20 (05/12 0513) BP: (91-112)/(45-58) 100/45 mmHg (05/12 1237) SpO2:  [98 %-100 %] 100 % (05/12 0513) Weight:  [96.9 kg (213 lb 10 oz)] 96.9 kg (213 lb 10 oz) (05/12 0513) Weight change: 0.6 kg (1 lb 5.2 oz)   PE:  No distress  Heart regular rhythm  Lungs clear  Abdomen soft, ascites  Lab Results: Results for orders placed or performed during the hospital encounter of 08/12/15 (from the past 24 hour(s))  Occult blood card to lab, stool     Status: None   Collection Time: 08/19/15  3:18 PM  Result Value Ref Range   Fecal Occult Bld NEGATIVE NEGATIVE  Protime-INR     Status: Abnormal   Collection Time: 08/20/15  2:15 AM  Result Value Ref Range   Prothrombin Time 21.5 (H) 11.6 - 15.2 seconds   INR 1.87 (H) 0.00 - 1.49  CBC     Status: Abnormal   Collection Time: 08/20/15  2:15 AM  Result Value Ref Range   WBC 13.2 (H) 4.0 - 10.5 K/uL   RBC 2.81 (L) 3.87 - 5.11 MIL/uL   Hemoglobin 10.5 (L) 12.0 - 15.0 g/dL   HCT 16.130.0 (L) 09.636.0 - 04.546.0 %   MCV 106.8 (H) 78.0 - 100.0 fL   MCH 37.4 (H) 26.0 - 34.0 pg   MCHC 35.0 30.0 - 36.0 g/dL   RDW 40.914.9 81.111.5 - 91.415.5 %   Platelets 138 (L) 150 - 400 K/uL  Basic metabolic panel     Status: Abnormal   Collection Time: 08/20/15  2:15 AM  Result Value Ref Range   Sodium 141 135 - 145 mmol/L   Potassium 3.6 3.5 - 5.1 mmol/L   Chloride 113 (H) 101 - 111 mmol/L   CO2 19 (L) 22 - 32 mmol/L   Glucose, Bld 117 (H) 65 - 99 mg/dL   BUN 22 (H) 6 - 20 mg/dL   Creatinine, Ser 7.821.45 (H) 0.44 - 1.00 mg/dL   Calcium 8.2 (L) 8.9 - 10.3 mg/dL   GFR calc non Af Amer 42 (L) >60 mL/min   GFR calc Af Amer 48 (L) >60 mL/min   Anion gap 9 5 - 15     Studies/Results: No results found.    Assessment: Alcoholic cirrhosis of the liver with portal hypertension and ascites and hepatic encephalopathy.  Plan:   Continue supportive care. Continue low-sodium diet. Continue Aldactone and Lasix for ascites. Continue lactulose Xifaxan for hepatic encephalopathy. She must avoid alcohol. I have nothing more to suggest at this time. We will sign off. Call us if needed. When she is discharged she [facility]    SAM F Feras Gardella 08/20/2015, 1:05 PM  Pager: [phone] If no answer or after 5 PM call [phone]

## 2015-08-20 NOTE — Progress Notes (Signed)
PROGRESS NOTE  Rhonda Bitterrinia Horiuchi HYQ:[id number] DOB: 04-05-1966 DOA: 08/12/2015 PCP: No PCP Per Patient  Brief Summary:  Rhonda Fuentes is a 50 y.o. female with PMH significant for alcoholic cirrhosis, hypothyroidism, chronic pain syndrome and recent admission due to PNA and encephalopathy ([facility] Patient presented from GI office due to ongoing jaundice, confusion, low BP and difficulty breathing. In ED found to have abnormal CXR (with new infiltrates component) and features of sepsis. Patient endorses swelling of her legs and abdomen.   She was hospitalized in [facility] on 4/6-4/10 and 4/24-4/26. Her sister  Rhonda Fuentes thinks she is prematurely discharged from prior hospitalization,   5/11 patient is more alert and oriented to place and person.  Her hallucinations have improved.  She is more pleasant and not angry.    HPI/Recap of past 24 hours:  As per RN, she tried to pull the PICC line.   Assessment/Plan: Principal Problem:   Altered mental status Active Problems:   Alcoholic hepatitis with ascites   Pneumonia   Encephalopathy, hepatic (HCC)   Alcoholic cirrhosis (HCC)   Lactic acidosis   Hypothyroidism   GERD (gastroesophageal reflux disease)   Sepsis (HCC)   HCAP (healthcare-associated pneumonia)   Confusion  1-Sepsis (HCC): with concerns for HCAP  -patient recently admitted for PNA and hepatic encephalopathy -patient completed treatment according to instructions; but presented still with SOB, tachypnea, elevated WBC's and soft BP on admission. -patient found to have elevated lactic acid and CXR demonstrating new right lower lobe infiltrates. - sepsis protocol, admitted to stepdown;  lactic acid normalized,, moved to med/tele on 5/6 -Vanc and cefepime started from admission, then changed to vanc/rocephin/zithro, dropped zithro due to frequent NSVT. Then changed to oral doxycycline on 5/7, repeat CT chest with persistent multifocal pna, restarted vanc/ cefepime.    -poor iv access, patient pulled out piccline, she will need repeat PICC line placement. Which was placed on 5/11 and restarted her IV antibiotics.     2-Alcoholic hepatitis/cirrhosis with ascites: -negative hepatitis panel, ascites cytology no malignant cells reported, CEA/afp negative, family reported patient took lots of tylenol for pain control after ankle surgery a few months ago, tylenol level low, + family history of alcohol. -s/p paracentesis, paritoneal fluids analysis:  wbc 72, gram stain no organism seen , no ab pain, no sbp. -GI was consulted To assist with treatment/care of her cirrhosis  - repeat ammonia level is normal.   3-confusion, hallucination, delirium, per chart review she has been having hallucination during last two hospitalization, confusion likely combination of alcohol withdrawal, wernicke's, infection, hepatic encephalopathy: -on lactulose, started rifaximin,  Her confusion has improved.  -ct head negative, psychiatry consulted for capacity eval, alcohol abuse, delirium, hallucination, family aware. She is deemed non competant to make decisions.  Discussed with the patient and her sister Rhonda Fuentes.   4-Hypothyroidism: -TSH 5.144 -on synthroid  5-GERD (gastroesophageal reflux disease): -continue PPI  6-chronic pain syndrome: - decrease/minimize narcotics currently given low BP and encephalopathy -has not required any pain meds in the hospital  7. INR elevated: likely from cirrhosis and sepsis, hold prophylaxis heparin, monitor INR  8. Bilateral lower extremity edema: likely from cirrhosis, venous us no DVT  9. Hypokalemia/hypomagnesemia: replace k/mag as needed.   10; frequent NSVT: echo adequate EF, no wall motion abnromalities, keep mag>2 and k>4, started betablocker. Not a candidate for amiodarone due to liver impairment. D/c zithromax. Correct mild acidosis with sodium bicarb tabs. Better, Cardiology consulted, recommedations given. .  11: cr elevated  on 5/7, from vanc? Slightly supratherapeutic, from diuretics? From hypotension? Hepatorenal? Monitor. Her creatinine increased to 1.57, if it continues to increase, will get UA, and US renal.  will probably need further diuresis, as she appears fluid overloaded. Increased lasix to 40 mg BID.   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  DVT prophylaxis: scd's Code Status: Full code Family Communication: none at bedside.  Disposition Plan: pending PT consult. Discussed with sister Rhonda Fanning who is in [place].  Consults called:  [facility] for cirrhosis and related conditions Cardiology for NSVT   Procedures:  US guided paracentesis on 5/4  Antibiotics:  Vanc from admission to 5/7, restarted from 5/8  cefepime from admission to 5/6 , restarted on 5/8  Rocephin/zithro from 5/6 to 5/7  Doxycycline from 5/7 to 5/8.   Objective: BP 97/63 mmHg  Pulse 79  Temp(Src) 97.3 F (36.3 C) (Oral)  Resp 18  Ht  (1.727 m)  Wt 96.9 kg (213 lb 10 oz)  BMI 32.49 kg/m2  SpO2 100%  LMP 10/21/2014 (Approximate)  Intake/Output Summary (Last 24 hours) at 08/20/15 1732 Last data filed at 08/20/15 0557  Gross per 24 hour  Intake    560 ml  Output      0 ml  Net    560 ml   Filed Weights   08/18/15 0659 08/19/15 0515 08/20/15 0513  Weight: 97.8 kg (215 lb 9.8 oz) 96.3 kg (212 lb 4.9 oz) 96.9 kg (213 lb 10 oz)    Exam:   General:  Fail, slightly confused, no hallucinations today.   Cardiovascular: RRR  Respiratory: improved aeration, no wheezing  Abdomen: less distended, but soft, NT, positive BS  Musculoskeletal:diffuse 3+bilateral lower extremity Edema  Neuro: slight confusion,  Data Reviewed: Basic Metabolic Panel:  Recent Labs Lab 08/13/15 1755 08/14/15 0200 08/14/15 1400 08/15/15 0310 08/16/15 0336 08/17/15 0511 08/19/15 0416 08/20/15 0215  NA 137 135 138 136 137 139 143 141  K 3.2* 3.5 3.2* 4.3 4.1 3.8 3.6 3.6  CL 109 110 114* 110 111 113* 115* 113*   CO2 18* 18* 15* 17* 17* 18* 20* 19*  GLUCOSE 128* 159* 154* 164* 135* 141* 123* 117*  BUN 22* 22*  CREATININE 0.92 0.95 1.00 1.15* 1.27* 1.44* 1.57* 1.45*  CALCIUM 7.3* 7.1* 6.7* 7.7* 8.0* 8.4* 8.3* 8.2*  MG 1.3* 1.8 2.1 2.2 2.2  --   --   --    Liver Function Tests:  Recent Labs Lab 08/13/15 1755 08/14/15 0200 08/16/15 0336 08/17/15 0511  AST 122* 111* 113* 166*  ALT 32 27 34 45  ALKPHOS 111 104 115 110  BILITOT 7.0* 6.8* 6.1* 6.2*  PROT 4.8* 4.7* 5.3* 5.1*  ALBUMIN 1.8* 1.7* 2.0* 2.2*   No results for input(s): LIPASE, AMYLASE in the last 168 hours.  Recent Labs Lab 08/15/15 0310 08/16/15 0336 08/17/15 0511 08/19/15 0416  AMMONIA 49* 40* 30 18   CBC:  Recent Labs Lab 08/15/15 0310 08/16/15 0336 08/17/15 0511 08/19/15 0416 08/20/15 0215  WBC 17.4* 18.3* 16.0* 10.9* 13.2*  HGB 10.4* 10.2* 10.7* 10.5* 10.5*  HCT 29.4* 29.5* 30.3* 30.9* 30.0*  MCV 106.1* 105.4* 106.7* 108.0* 106.8*  PLT 254 172 103* 64* 138*   Cardiac Enzymes:   No results for input(s): CKTOTAL, CKMB, CKMBINDEX, TROPONINI in the last 168 hours. BNP (last 3 results) No results for input(s): BNP in the last 8760 hours.  ProBNP (last 3 results) No results for input(s): PROBNP  in the last 8760 hours.  CBG: No results for input(s): GLUCAP in the last 168 hours.  Recent Results (from the past 240 hour(s))  Culture, blood (Routine X 2) w Reflex to ID Panel     Status: None   Collection Time: 08/12/15  1:38 PM  Result Value Ref Range Status   Specimen Description BLOOD RIGHT ANTECUBITAL  Final   Special Requests IN PEDIATRIC BOTTLE 5CC  Final   Culture   Final    NO GROWTH 5 DAYS Performed at [facility]    Report Status 08/17/2015 FINAL  Final  Culture, blood (Routine X 2) w Reflex to ID Panel     Status: None   Collection Time: 08/12/15  2:08 PM  Result Value Ref Range Status   Specimen Description BLOOD LEFT ANTECUBITAL  Final   Special Requests IN PEDIATRIC  BOTTLE 2 CC  Final   Culture   Final    NO GROWTH 5 DAYS Performed at [facility]    Report Status 08/17/2015 FINAL  Final  MRSA PCR Screening     Status: Abnormal   Collection Time: 08/12/15  4:45 PM  Result Value Ref Range Status   MRSA by PCR POSITIVE (A) NEGATIVE Final    Comment:        The GeneXpert MRSA Assay (FDA approved for NASAL specimens only), is one component of a comprehensive MRSA colonization surveillance program. It is not intended to diagnose MRSA infection nor to guide or monitor treatment for MRSA infections. RESULT CALLED TO, READ BACK BY AND VERIFIED WITH: CAROLYN CREECH,RN [id number] @ 1805 BY J SCOTTON   Urine culture     Status: Abnormal   Collection Time: 08/12/15  9:23 PM  Result Value Ref Range Status   Specimen Description URINE, CLEAN CATCH  Final   Special Requests NONE  Final   Culture (A)  Final    1,000 COLONIES/mL INSIGNIFICANT GROWTH Performed at [facility]    Report Status 08/14/2015 FINAL  Final  Culture, body fluid-bottle     Status: None   Collection Time: 08/13/15 10:07 AM  Result Value Ref Range Status   Specimen Description FLUID PERITONEAL  Final   Special Requests BOTTLES DRAWN AEROBIC AND ANAEROBIC 10CC  Final   Culture   Final    NO GROWTH 5 DAYS Performed at [facility]    Report Status 08/18/2015 FINAL  Final  Gram stain     Status: None   Collection Time: 08/13/15 10:07 AM  Result Value Ref Range Status   Specimen Description FLUID PERITONEAL  Final   Special Requests NONE  Final   Gram Stain   Final    FEW WBC PRESENT, PREDOMINANTLY MONONUCLEAR NO ORGANISMS SEEN Performed at [facility]    Report Status 08/13/2015 FINAL  Final     Studies: No results found.  Scheduled Meds: . ceFEPime (MAXIPIME) IV  1 g Intravenous Q12H  . folic acid  1 mg Oral Daily  . furosemide  40 mg Intravenous BID  . guaiFENesin  600 mg Oral BID  . lactulose  30 g Oral BID  . levothyroxine  50 mcg  Oral QAC breakfast  . metoprolol tartrate  12.5 mg Oral BID  . mupirocin ointment   Nasal BID  . pantoprazole  40 mg Oral BID  . QUEtiapine  25 mg Oral BID  . rifaximin  550 mg Oral BID  . sodium bicarbonate  650 mg Oral BID  .  sodium chloride flush  10-40 mL Intracatheter Q12H  . spironolactone  25 mg Oral Daily  . thiamine  100 mg Oral Daily  . vancomycin  750 mg Intravenous Q12H    Continuous Infusions:     Time spent:  Jobany Montellano MD, PhD  [facility] Pager [phone] If 7PM-7AM, please contact night-coverage at [email or web address], password [facility] 08/20/2015, 5:32 PM  LOS: 8 days

## 2015-08-20 NOTE — Progress Notes (Signed)
RN found patient blood all over her arm and PICC line was out.  Patient unsure what happened.  RN Called IV nurse. IV nurse Fixed the PICC line. Will continue to monitor.

## 2015-08-21 LAB — BASIC METABOLIC PANEL
ANION GAP: 10 (ref 5–15)
BUN: 20 mg/dL (ref 6–20)
CALCIUM: 8 mg/dL — AB (ref 8.9–10.3)
CO2: 18 mmol/L — AB (ref 22–32)
Chloride: 110 mmol/L (ref 101–111)
Creatinine, Ser: 1.56 mg/dL — ABNORMAL HIGH (ref 0.44–1.00)
GFR calc Af Amer: 44 mL/min — ABNORMAL LOW (ref >60)
GFR calc non Af Amer: 38 mL/min — ABNORMAL LOW (ref >60)
Glucose, Bld: 104 mg/dL — ABNORMAL HIGH (ref 65–99)
Potassium: 2.9 mmol/L — ABNORMAL LOW (ref 3.5–5.1)
Sodium: 138 mmol/L (ref 135–145)

## 2015-08-21 LAB — PROTIME-INR
INR: 1.9 — AB (ref 0.00–1.49)
PROTHROMBIN TIME: 21.7 s — AB (ref 11.6–15.2)

## 2015-08-21 LAB — VANCOMYCIN, TROUGH: VANCOMYCIN TR: 28 ug/mL — AB (ref 10.0–20.0)

## 2015-08-21 LAB — MAGNESIUM: MAGNESIUM: 1.6 mg/dL — AB (ref 1.7–2.4)

## 2015-08-21 MED ORDER — POTASSIUM CHLORIDE CRYS ER 20 MEQ PO TBCR
40.0000 meq | EXTENDED_RELEASE_TABLET | Freq: Once | ORAL | Status: AC
  Administered 2015-08-21: 40 meq via ORAL
  Filled 2015-08-21: qty 2

## 2015-08-21 MED ORDER — POTASSIUM CHLORIDE 10 MEQ/100ML IV SOLN
10.0000 meq | INTRAVENOUS | Status: AC
  Administered 2015-08-21 (×3): 10 meq via INTRAVENOUS
  Filled 2015-08-21 (×3): qty 100

## 2015-08-21 NOTE — Progress Notes (Signed)
Pharmacy Antibiotic Note  Rhonda Fuentes is a 50 y.o. female admitted on 08/12/2015 with Rhonda Fuentes is a 50 y.o. female admitted on 08/12/2015 with PNA and hepatic encephalopathy. Vanc and cefepime started from admission, but narrowed to oral doxycycline on 5/7. Repeat CT chest with persistent multifocal pna, and MD will broaden abx to vanc/ cefepime per pharmacy dosing.  Pt pulled out PICC line on 5/8 PM and has been unable to get IV medications since that time.  She was started on PO antibiotics on 5/10. A new PICC line was placed 5/11 and Pharmacy is consulted to resume Vancomycin and Cefepime dosing for pneumonia.  Plan:  Cefepime 1g IV q12h  Hold vancomycin  Recheck vancomycin level 5/14 at 0500 (~ 27 hrs after last dose)  Pharmacy to re-enter doses based on levels.  Follow up renal fxn, culture results, and clinical course.   Height: 5\' 8"  (172.7 cm) Weight:  (pt agitated--ref. weight) IBW/kg (Calculated) : 63.9  Temp (24hrs), Avg:97.8 F (36.6 C), Min:97.3 F (36.3 C), Max:98.1 F (36.7 C)   Recent Labs Lab 08/14/15 1535 08/15/15 0310 08/16/15 0336 08/17/15 0511 08/19/15 0416 08/20/15 0215 08/21/15 0640  WBC  --  17.4* 18.3* 16.0* 10.9* 13.2*  --   CREATININE  --  1.15* 1.27* 1.44* 1.57* 1.45* 1.56*  LATICACIDVEN  --   --   --  1.7  --   --   --   VANCOTROUGH 24*  --   --   --   --   --   --     Estimated Creatinine Clearance: 53.1 mL/min (by C-G formula based on Cr of 1.56).    No Known Allergies  Antimicrobials this admission: 5/4 Zosyn x1 5/4 Vancomycin >> 5/7, resumed 5/8 >>5/8, resumed 5/11 >> 5/4 Cefepime >> 5/6, resumed 5/8 >>5/8, resumed 5/11 >> 5/6 Ceftriaxone >> 5/7 5/6 Azithromycin >> 5/6 5/7 Doxy >> 5/8, resumed 5/11 >>5/11 5/10 Cefpodoxime >> 5/11   Levels/Dose adjustments this admission: 5/5 VT = canceled; NOT trough, disregard this level 5/6 1530 VT = 24 on 1g q8, adjusting to 1250 mg IV q12h (SCr 0.95) 5/13 1300 VT = 28 on 750mg  q12h  (prior to 5th dose, last on 5/13 at 0200) (SCr 1.56)  Microbiology results: 5/4 BCx: NGF 5/4 MRSA PCR: positive 5/4 Urine: 1K colonies, insignificant growth 5/4: Strep Pneumo: neg 5/5: Peritoneal fluid: NGF  Thank you for allowing pharmacy to be a part of this patient's care.  Lynann Beaverhristine Munira Polson PharmD, BCPS Pager [phone] 08/21/2015 11:19 AM

## 2015-08-21 NOTE — Progress Notes (Addendum)
CRITICAL VALUE ALERT  Critical value received:  Vancomycin trough 28  Date of notification:  08/21/2015  Time of notification:  1358  Critical value read back:Yes.    Nurse who received alert:  B.Clelia CroftShaw  MD notified (1st page):  Blake DivineAkula  Time of first page:  1400  MD notified (2nd page):  Time of second page:  Responding MD:  Blake DivineAkula  Time MD responded:   1415

## 2015-08-21 NOTE — Progress Notes (Signed)
PROGRESS NOTE  [name]:[id number] DOB: [birth year] DOA: 08/12/2015 PCP: No PCP Per Patient  Brief Summary:  Rhonda Fuentes is a 50 y.o. female with PMH significant for alcoholic cirrhosis, hypothyroidism, chronic pain syndrome and recent admission due to PNA and encephalopathy (admitted to [facility]). Patient presented from GI office due to ongoing jaundice, confusion, low BP and difficulty breathing. In ED found to have abnormal CXR (with new infiltrates component) and features of sepsis. Patient endorses swelling of her legs and abdomen.   She was hospitalized in [place] [facility] on 4/6-4/10 and 4/24-4/26. Her sister  Rhonda Fuentes thinks she is prematurely discharged from prior hospitalization,   5/11 patient is more alert and oriented to place and person.  Her hallucinations have improved.  She is more pleasant and not angry. She still has bouts of confusion today, struggling to tell me where she is and trying to sound rational.    HPI/Recap of past 24 hours:  No events overnight, wants the picc line out.  Assessment/Plan: Principal Problem:   Altered mental status Active Problems:   Alcoholic hepatitis with ascites   Pneumonia   Encephalopathy, hepatic (HCC)   Alcoholic cirrhosis (HCC)   Lactic acidosis   Hypothyroidism   GERD (gastroesophageal reflux disease)   Sepsis (HCC)   HCAP (healthcare-associated pneumonia)   Confusion  1-Sepsis (HCC): with concerns for HCAP  -patient recently admitted for PNA and hepatic encephalopathy -patient completed treatment according to instructions; but presented still with SOB, tachypnea, elevated WBC's and soft BP on admission. -patient found to have elevated lactic acid and CXR demonstrating new right lower lobe infiltrates. - sepsis protocol, admitted to stepdown;  lactic acid normalized,, moved to med/tele on 5/6 -Vanc and cefepime started from admission, then changed to vanc/rocephin/zithro, dropped zithro due to frequent NSVT. Then  changed to oral doxycycline on 5/7, repeat CT chest with persistent multifocal pna, restarted vanc/ cefepime. Stopped the vancomycin as her creatinine is starting to go up and the trough is 28. -poor iv access, patient pulled out piccline, she will need repeat PICC line placement. Which was placed on 5/11 and restarted her IV antibiotics( cefepime).     2-Alcoholic hepatitis/cirrhosis with ascites: -negative hepatitis panel, ascites cytology no malignant cells reported, CEA/afp negative, family reported patient took lots of tylenol for pain control after ankle surgery a few months ago, tylenol level low, + family history of alcohol. -s/p paracentesis, paritoneal fluids analysis:  wbc 72, gram stain no organism seen , no ab pain, no sbp. -GI was consulted To assist with treatment/care of her cirrhosis  - repeat ammonia level is normal.   3-confusion, hallucination, delirium, per chart review she has been having hallucination during last two hospitalization, confusion likely combination of alcohol withdrawal, wernicke's, infection, hepatic encephalopathy: -on lactulose, started rifaximin,  Her confusion has improved.  -ct head negative, psychiatry consulted for capacity eval, alcohol abuse, delirium, hallucination, family aware. She is deemed non competant to make decisions.  Discussed with the patient and her sister Rhonda Fuentes.   4-Hypothyroidism: -TSH 5.144 -on synthroid  5-GERD (gastroesophageal reflux disease): -continue PPI  6-chronic pain syndrome: - decrease/minimize narcotics currently given low BP and encephalopathy -has not required any pain meds in the hospital  7. INR elevated: likely from cirrhosis and sepsis, hold prophylaxis heparin, monitor INR  8. Bilateral lower extremity edema: likely from cirrhosis, venous US no DVT  9. Hypokalemia/hypomagnesemia: replace k/mag as needed.   10; frequent NSVT: echo adequate EF, no wall motion abnromalities, keep mag>2 and  k>4, started  betablocker. Not a candidate for amiodarone due to liver impairment. D/c zithromax. Correct mild acidosis with sodium bicarb tabs. Better, Cardiology consulted, recommedations given. .  11: cr elevated on 5/7, from vanc? Slightly supratherapeutic, from diuretics? From hypotension? Hepatorenal? Monitor. Her creatinine increased to 1.57, if it continues to increase, will get UA, and US renal.  will probably need further diuresis, as she appears fluid overloaded. Increased lasix to 40 mg BID. Creatinine is around 1.56 today.   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  DVT prophylaxis: scd's Code Status: Full code Family Communication: none at bedside.  Disposition Plan: pending PT consult. Discussed with sister Rhonda Fuentes who is in [place].  Consults called:  [facility] for cirrhosis and related conditions Cardiology for NSVT   Procedures:  US guided paracentesis on 5/4  Antibiotics:  Vanc from admission to 5/7, restarted from 5/8  cefepime from admission to 5/6 , restarted on 5/8  Rocephin/zithro from 5/6 to 5/7  Doxycycline from 5/7 to 5/8.   Objective: BP 106/67 mmHg  Pulse 87  Temp(Src) 98.1 F (36.7 C) (Oral)  Resp 20  Ht  (1.727 m)  Wt 96.9 kg (213 lb 10 oz)  BMI 32.49 kg/m2  SpO2 100%  LMP 10/21/2014 (Approximate)  Intake/Output Summary (Last 24 hours) at 08/21/15 1826 Last data filed at 08/21/15 1246  Gross per 24 hour  Intake    200 ml  Output   1600 ml  Net  -1400 ml   Filed Weights   08/18/15 0659 08/19/15 0515 08/20/15 0513  Weight: 97.8 kg (215 lb 9.8 oz) 96.3 kg (212 lb 4.9 oz) 96.9 kg (213 lb 10 oz)    Exam:   General:  Fail, slightly confused, no hallucinations today.   Cardiovascular: RRR  Respiratory: improved aeration, no wheezing  Abdomen: less distended, but soft, NT, positive BS  Musculoskeletal:diffuse 3+bilateral lower extremity Edema  Neuro: slight confusion,  Data Reviewed: Basic Metabolic Panel:  Recent  Labs Lab 08/15/15 0310 08/16/15 0336 08/17/15 0511 08/19/15 0416 08/20/15 0215 08/21/15 0640 08/21/15 1026  NA 136 137 139 143 141 138  --   K 4.3 4.1 3.8 3.6 3.6 2.9*  --   CL 110 111 113* 115* 113* 110  --   CO2 17* 17* 18* 20* 19* 18*  --   GLUCOSE 164* 135* 141* 123* 117* 104*  --   BUN 22* 22* 20  --   CREATININE 1.15* 1.27* 1.44* 1.57* 1.45* 1.56*  --   CALCIUM 7.7* 8.0* 8.4* 8.3* 8.2* 8.0*  --   MG 2.2 2.2  --   --   --   --  1.6*   Liver Function Tests:  Recent Labs Lab 08/16/15 0336 08/17/15 0511  AST 113* 166*  ALT 34 45  ALKPHOS 115 110  BILITOT 6.1* 6.2*  PROT 5.3* 5.1*  ALBUMIN 2.0* 2.2*   No results for input(s): LIPASE, AMYLASE in the last 168 hours.  Recent Labs Lab 08/15/15 0310 08/16/15 0336 08/17/15 0511 08/19/15 0416  AMMONIA 49* 40* 30 18   CBC:  Recent Labs Lab 08/15/15 0310 08/16/15 0336 08/17/15 0511 08/19/15 0416 08/20/15 0215  WBC 17.4* 18.3* 16.0* 10.9* 13.2*  HGB 10.4* 10.2* 10.7* 10.5* 10.5*  HCT 29.4* 29.5* 30.3* 30.9* 30.0*  MCV 106.1* 105.4* 106.7* 108.0* 106.8*  PLT 254 172 103* 64* 138*   Cardiac Enzymes:   No results for input(s): CKTOTAL, CKMB, CKMBINDEX, TROPONINI in the last 168 hours. BNP (  last 3 results) No results for input(s): BNP in the last 8760 hours.  ProBNP (last 3 results) No results for input(s): PROBNP in the last 8760 hours.  CBG: No results for input(s): GLUCAP in the last 168 hours.  Recent Results (from the past 240 hour(s))  Culture, blood (Routine X 2) w Reflex to ID Panel     Status: None   Collection Time: 08/12/15  1:38 PM  Result Value Ref Range Status   Specimen Description BLOOD RIGHT ANTECUBITAL  Final   Special Requests IN PEDIATRIC BOTTLE 5CC  Final   Culture   Final    NO GROWTH 5 DAYS Performed at [facility]    Report Status 08/17/2015 FINAL  Final  Culture, blood (Routine X 2) w Reflex to ID Panel     Status: None   Collection Time: 08/12/15  2:08 PM   Result Value Ref Range Status   Specimen Description BLOOD LEFT ANTECUBITAL  Final   Special Requests IN PEDIATRIC BOTTLE 2 CC  Final   Culture   Final    NO GROWTH 5 DAYS Performed at [facility]    Report Status 08/17/2015 FINAL  Final  MRSA PCR Screening     Status: Abnormal   Collection Time: 08/12/15  4:45 PM  Result Value Ref Range Status   MRSA by PCR POSITIVE (A) NEGATIVE Final    Comment:        The GeneXpert MRSA Assay (FDA approved for NASAL specimens only), is one component of a comprehensive MRSA colonization surveillance program. It is not intended to diagnose MRSA infection nor to guide or monitor treatment for MRSA infections. RESULT CALLED TO, READ BACK BY AND VERIFIED WITH: CAROLYN CREECH,RN [id number] @ 1805 BY J SCOTTON   Urine culture     Status: Abnormal   Collection Time: 08/12/15  9:23 PM  Result Value Ref Range Status   Specimen Description URINE, CLEAN CATCH  Final   Special Requests NONE  Final   Culture (A)  Final    1,000 COLONIES/mL INSIGNIFICANT GROWTH Performed at [facility]    Report Status 08/14/2015 FINAL  Final  Culture, body fluid-bottle     Status: None   Collection Time: 08/13/15 10:07 AM  Result Value Ref Range Status   Specimen Description FLUID PERITONEAL  Final   Special Requests BOTTLES DRAWN AEROBIC AND ANAEROBIC 10CC  Final   Culture   Final    NO GROWTH 5 DAYS Performed at [facility]    Report Status 08/18/2015 FINAL  Final  Gram stain     Status: None   Collection Time: 08/13/15 10:07 AM  Result Value Ref Range Status   Specimen Description FLUID PERITONEAL  Final   Special Requests NONE  Final   Gram Stain   Final    FEW WBC PRESENT, PREDOMINANTLY MONONUCLEAR NO ORGANISMS SEEN Performed at [facility]    Report Status 08/13/2015 FINAL  Final     Studies: No results found.  Scheduled Meds: . ceFEPime (MAXIPIME) IV  1 g Intravenous Q12H  . folic acid  1 mg Oral Daily  .  furosemide  40 mg Intravenous BID  . guaiFENesin  600 mg Oral BID  . lactulose  30 g Oral BID  . levothyroxine  50 mcg Oral QAC breakfast  . metoprolol tartrate  12.5 mg Oral BID  . pantoprazole  40 mg Oral BID  . QUEtiapine  25 mg Oral BID  . rifaximin  550 mg Oral BID  . sodium bicarbonate  650 mg Oral BID  . sodium chloride flush  10-40 mL Intracatheter Q12H  . spironolactone  25 mg Oral Daily  . thiamine  100 mg Oral Daily    Continuous Infusions:     Time spent: 25mins  Mareo Portilla MD, PhD  [facility] Pager [phone] If 7PM-7AM, please contact night-coverage at [email or web address], password [email or web address] 08/21/2015, 6:26 PM  LOS: 9 days

## 2015-08-22 LAB — BASIC METABOLIC PANEL
ANION GAP: 7 (ref 5–15)
BUN: 21 mg/dL — ABNORMAL HIGH (ref 6–20)
CHLORIDE: 114 mmol/L — AB (ref 101–111)
CO2: 21 mmol/L — AB (ref 22–32)
Calcium: 8 mg/dL — ABNORMAL LOW (ref 8.9–10.3)
Creatinine, Ser: 1.42 mg/dL — ABNORMAL HIGH (ref 0.44–1.00)
GFR calc Af Amer: 49 mL/min — ABNORMAL LOW (ref >60)
GFR calc non Af Amer: 43 mL/min — ABNORMAL LOW (ref >60)
GLUCOSE: 100 mg/dL — AB (ref 65–99)
POTASSIUM: 3.2 mmol/L — AB (ref 3.5–5.1)
Sodium: 142 mmol/L (ref 135–145)

## 2015-08-22 LAB — PROTIME-INR
INR: 2.04 — ABNORMAL HIGH (ref 0.00–1.49)
Prothrombin Time: 22.9 seconds — ABNORMAL HIGH (ref 11.6–15.2)

## 2015-08-22 MED ORDER — MAGNESIUM SULFATE 2 GM/50ML IV SOLN
2.0000 g | Freq: Once | INTRAVENOUS | Status: AC
  Administered 2015-08-22: 2 g via INTRAVENOUS
  Filled 2015-08-22: qty 50

## 2015-08-22 MED ORDER — POTASSIUM CHLORIDE CRYS ER 20 MEQ PO TBCR
40.0000 meq | EXTENDED_RELEASE_TABLET | Freq: Two times a day (BID) | ORAL | Status: AC
  Administered 2015-08-22 (×2): 40 meq via ORAL
  Filled 2015-08-22 (×2): qty 2

## 2015-08-22 NOTE — Progress Notes (Addendum)
PROGRESS NOTE  Rhonda Fuentes ZOX:[id number] DOB: 1965-09-21 DOA: 08/12/2015 PCP: No PCP Per Patient  Brief Summary:  Rhonda Fuentes is a 50 y.o. female with PMH significant for alcoholic cirrhosis, hypothyroidism, chronic pain syndrome and recent admission due to PNA and encephalopathy (admitted to [facility]). Patient presented from GI office due to ongoing jaundice, confusion, low BP and difficulty breathing. In ED found to have abnormal CXR (with new infiltrates component) and features of sepsis. Patient endorses swelling of her legs and abdomen.   She was hospitalized in [place] [facility] on 4/6-4/10 and 4/24-4/26. Her sister  Rhonda Fuentes thinks she is prematurely discharged from prior hospitalization,   5/11 patient is more alert and oriented to place and person.  Her hallucinations have improved.  She is more pleasant and not angry. She still has bouts of confusion today, struggling to tell me where she is and trying to sound rational.  No new complaints today.    HPI/Recap of past 24 hours:  Resting comfortably.  Assessment/Plan: Principal Problem:   Altered mental status Active Problems:   Alcoholic hepatitis with ascites   Pneumonia   Encephalopathy, hepatic (HCC)   Alcoholic cirrhosis (HCC)   Lactic acidosis   Hypothyroidism   GERD (gastroesophageal reflux disease)   Sepsis (HCC)   HCAP (healthcare-associated pneumonia)   Confusion  1-Sepsis (HCC): with concerns for HCAP  -patient recently admitted for PNA and hepatic encephalopathy -patient completed treatment according to instructions; but presented still with SOB, tachypnea, elevated WBC's and soft BP on admission. -patient found to have elevated lactic acid and CXR demonstrating new right lower lobe infiltrates. - sepsis protocol, admitted to stepdown;  lactic acid normalized,, moved to med/tele on 5/6 -Vanc and cefepime started from admission, then changed to vanc/rocephin/zithro, dropped zithro due to frequent NSVT. Then  changed to oral doxycycline on 5/7, repeat CT chest with persistent multifocal pna, restarted vanc/ cefepime. Stopped the vancomycin as her creatinine is starting to go up and the trough is 28. -poor iv access, patient pulled out piccline, she will need repeat PICC line placement. Which was placed on 5/11 and restarted her IV antibiotics( cefepime).     2-Alcoholic hepatitis/cirrhosis with ascites: -negative hepatitis panel, ascites cytology no malignant cells reported, CEA/afp negative, family reported patient took lots of tylenol for pain control after ankle surgery a few months ago, tylenol level low, + family history of alcohol. -s/p paracentesis, paritoneal fluids analysis:  wbc 72, gram stain no organism seen , no ab pain, no sbp. -GI was consulted To assist with treatment/care of her cirrhosis  - repeat ammonia level is normal.   3-confusion, hallucination, delirium, per chart review she has been having hallucination during last two hospitalization, confusion likely combination of alcohol withdrawal, wernicke's, infection, hepatic encephalopathy: -on lactulose, started rifaximin,  Her confusion has improved.  -ct head negative, psychiatry consulted for capacity eval, alcohol abuse, delirium, hallucination, family aware. She is deemed non competant to make decisions.  Discussed with the patient and her sister Rhonda Fuentes.   4-Hypothyroidism: -TSH 5.144 -on synthroid  5-GERD (gastroesophageal reflux disease): -continue PPI  6-chronic pain syndrome: - decrease/minimize narcotics currently given low BP and encephalopathy -has not required any pain meds in the hospital  7. INR elevated: likely from cirrhosis and sepsis, hold prophylaxis heparin, monitor INR  8. Bilateral lower extremity edema: likely from cirrhosis, venous US no DVT  9. Hypokalemia/hypomagnesemia: replace k/mag as needed.   10; frequent NSVT: echo adequate EF, no wall motion abnromalities, keep mag>2 and k>4,  started  betablocker. Not a candidate for amiodarone due to liver impairment. D/c zithromax. Correct mild acidosis with sodium bicarb tabs. Better, Cardiology consulted, recommedations given. .  11: cr elevated on 5/7, from vanc? Slightly supratherapeutic, from diuretics? From hypotension? Hepatorenal? Monitor. Her creatinine increased to 1.57, if it continues to increase, will get UA, and US renal.  will probably need further diuresis, as she appears fluid overloaded. Increased lasix to 40 mg BID. Creatinine is around 1.56 today.   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  DVT prophylaxis: scd's Code Status: Full code Family Communication: none at bedside.  Disposition Plan: pending PT consult. Discussed with sister Rhonda Fanningjulie who is in [place].  Consults called:  [facility] for cirrhosis and related conditions Cardiology for NSVT   Procedures:  [place] guided paracentesis on 5/4  Antibiotics:  Vanc from admission to 5/7, restarted from 5/8  cefepime from admission to 5/6 , restarted on 5/8  Rocephin/zithro from 5/6 to 5/7  Doxycycline from 5/7 to 5/8.   Objective: BP 94/55 mmHg  Pulse 89  Temp(Src) 98.2 F (36.8 C) (Oral)  Resp 20  Ht 5\' 8"  (1.727 m)  Wt 93 kg (205 lb 0.4 oz)  BMI 31.18 kg/m2  SpO2 100%  LMP 10/21/2014 (Approximate)  Intake/Output Summary (Last 24 hours) at 08/22/15 1901 Last data filed at 08/22/15 1412  Gross per 24 hour  Intake    580 ml  Output   2100 ml  Net  -1520 ml   Filed Weights   08/19/15 0515 08/20/15 0513 08/22/15 0551  Weight: 96.3 kg (212 lb 4.9 oz) 96.9 kg (213 lb 10 oz) 93 kg (205 lb 0.4 oz)    Exam:   General:  Fail, slightly confused, no hallucinations today.   Cardiovascular: RRR  Respiratory: improved aeration, no wheezing  Abdomen: less distended, but soft, NT, positive BS  Musculoskeletal:diffuse 3+bilateral lower extremity Edema  Neuro: slight confusion,  Data Reviewed: Basic Metabolic Panel:  Recent Labs Lab  08/16/15 0336 08/17/15 0511 08/19/15 0416 08/20/15 0215 08/21/15 0640 08/21/15 1026 08/22/15 0500  NA 137 139 143 141 138  --  142  K 4.1 3.8 3.6 3.6 2.9*  --  3.2*  CL 111 113* 115* 113* 110  --  114*  CO2 17* 18* 20* 19* 18*  --  21*  GLUCOSE 135* 141* 123* 117* 104*  --  100*  BUN 17 20 22* 22* 20  --  21*  CREATININE 1.27* 1.44* 1.57* 1.45* 1.56*  --  1.42*  CALCIUM 8.0* 8.4* 8.3* 8.2* 8.0*  --  8.0*  MG 2.2  --   --   --   --  1.6*  --    Liver Function Tests:  Recent Labs Lab 08/16/15 0336 08/17/15 0511  AST 113* 166*  ALT 34 45  ALKPHOS 115 110  BILITOT 6.1* 6.2*  PROT 5.3* 5.1*  ALBUMIN 2.0* 2.2*   No results for input(s): LIPASE, AMYLASE in the last 168 hours.  Recent Labs Lab 08/16/15 0336 08/17/15 0511 08/19/15 0416  AMMONIA 40* 30 18   CBC:  Recent Labs Lab 08/16/15 0336 08/17/15 0511 08/19/15 0416 08/20/15 0215  WBC 18.3* 16.0* 10.9* 13.2*  HGB 10.2* 10.7* 10.5* 10.5*  HCT 29.5* 30.3* 30.9* 30.0*  MCV 105.4* 106.7* 108.0* 106.8*  PLT 172 103* 64* 138*   Cardiac Enzymes:   No results for input(s): CKTOTAL, CKMB, CKMBINDEX, TROPONINI in the last 168 hours. BNP (last 3 results) No results for input(s): BNP in  the last 8760 hours.  ProBNP (last 3 results) No results for input(s): PROBNP in the last 8760 hours.  CBG: No results for input(s): GLUCAP in the last 168 hours.  Recent Results (from the past 240 hour(s))  Urine culture     Status: Abnormal   Collection Time: 08/12/15  9:23 PM  Result Value Ref Range Status   Specimen Description URINE, CLEAN CATCH  Final   Special Requests NONE  Final   Culture (A)  Final    1,000 COLONIES/mL INSIGNIFICANT GROWTH Performed at [facility]    Report Status 08/14/2015 FINAL  Final  Culture, body fluid-bottle     Status: None   Collection Time: 08/13/15 10:07 AM  Result Value Ref Range Status   Specimen Description FLUID PERITONEAL  Final   Special Requests BOTTLES DRAWN AEROBIC AND  ANAEROBIC 10CC  Final   Culture   Final    NO GROWTH 5 DAYS Performed at [facility]    Report Status 08/18/2015 FINAL  Final  Gram stain     Status: None   Collection Time: 08/13/15 10:07 AM  Result Value Ref Range Status   Specimen Description FLUID PERITONEAL  Final   Special Requests NONE  Final   Gram Stain   Final    FEW WBC PRESENT, PREDOMINANTLY MONONUCLEAR NO ORGANISMS SEEN Performed at [facility]    Report Status 08/13/2015 FINAL  Final     Studies: No results found.  Scheduled Meds: . ceFEPime (MAXIPIME) IV  1 g Intravenous Q12H  . folic acid  1 mg Oral Daily  . furosemide  40 mg Intravenous BID  . guaiFENesin  600 mg Oral BID  . lactulose  30 g Oral BID  . levothyroxine  50 mcg Oral QAC breakfast  . metoprolol tartrate  12.5 mg Oral BID  . pantoprazole  40 mg Oral BID  . potassium chloride  40 mEq Oral BID  . QUEtiapine  25 mg Oral BID  . rifaximin  550 mg Oral BID  . sodium bicarbonate  650 mg Oral BID  . sodium chloride flush  10-40 mL Intracatheter Q12H  . spironolactone  25 mg Oral Daily  . thiamine  100 mg Oral Daily    Continuous Infusions:     Time spent:  Tylon Kemmerling MD, PhD  [facility] Pager [phone] If 7PM-7AM, please contact night-coverage at [email or web address], password [facility] 08/22/2015, 7:01 PM  LOS: 10 days

## 2015-08-22 NOTE — Progress Notes (Signed)
Received report from Rosary LivelyHilda N. RN. Pt is resting comfortably. No new complaints. Will continue to follow poc

## 2015-08-23 LAB — BASIC METABOLIC PANEL
Anion gap: 5 (ref 5–15)
BUN: 19 mg/dL (ref 6–20)
CO2: 21 mmol/L — ABNORMAL LOW (ref 22–32)
Calcium: 7.9 mg/dL — ABNORMAL LOW (ref 8.9–10.3)
Chloride: 114 mmol/L — ABNORMAL HIGH (ref 101–111)
Creatinine, Ser: 1.44 mg/dL — ABNORMAL HIGH (ref 0.44–1.00)
GFR calc Af Amer: 49 mL/min — ABNORMAL LOW (ref >60)
GFR calc non Af Amer: 42 mL/min — ABNORMAL LOW (ref >60)
Glucose, Bld: 106 mg/dL — ABNORMAL HIGH (ref 65–99)
Potassium: 3.7 mmol/L (ref 3.5–5.1)
Sodium: 140 mmol/L (ref 135–145)

## 2015-08-23 LAB — PROTIME-INR
INR: 2.13 — ABNORMAL HIGH (ref 0.00–1.49)
PROTHROMBIN TIME: 23.6 s — AB (ref 11.6–15.2)

## 2015-08-23 LAB — MAGNESIUM: Magnesium: 1.8 mg/dL (ref 1.7–2.4)

## 2015-08-23 MED ORDER — QUETIAPINE FUMARATE 25 MG PO TABS
25.0000 mg | ORAL_TABLET | Freq: Three times a day (TID) | ORAL | Status: DC
  Administered 2015-08-23 – 2015-08-24 (×5): 25 mg via ORAL
  Filled 2015-08-23 (×5): qty 1

## 2015-08-23 NOTE — Consult Note (Signed)
[facility] Consult   Reason for Consult:  AMS and intermittent hallucinations and alcoholic cirrhosis and sepsis Referring Physician:  Dr. Karleen Hampshire Patient Identification: Rhonda Fuentes MRN:  [id number] Principal Diagnosis: Altered mental status Diagnosis:   Patient Active Problem List   Diagnosis Date Noted  . Confusion [R41.0]   . Altered mental status [R41.82]   . Encephalopathy, hepatic ([place]) [K72.90] 08/12/2015  . Alcoholic cirrhosis ([place]) [K53.97] 08/12/2015  . Lactic acidosis [E87.2] 08/12/2015  . Hypothyroidism [E03.9] 08/12/2015  . GERD (gastroesophageal reflux disease) [K21.9] 08/12/2015  . Sepsis ([place]) [A41.9] 08/12/2015  . HCAP (healthcare-associated pneumonia) [J18.9]   . Pneumonia [J18.9] 08/02/2015  . Alcoholic hepatitis with ascites [K70.11] 07/15/2015    Total Time spent with patient: 1 hour  Subjective:   Rhonda Fuentes is a 50 y.o. female patient admitted with hallucinations, sepsis, alcohol cirrhosis and pneumonia.  HPI:  [name] is a 50 y.o. Female, seen Chart reviewed and case discussed with staff RN and Dr. Karleen Hampshire Patient appeared lying on her bed and talking on the phone with her sister and asking questions like name of this provider and the speciality. Patient did not get off the phone even after introduced myself and stated she wanted her sister to be involved in the treatment and try to keep on the speaker phone without knowing there is a phone option is not available. When I try to help her with the speaker phone option I found that her sister was not on the phone at all. Patient stated she does not trust people in the hospital, the only people she can trust is her sister and her mother who were not in the hospital. Patient is also feels paranoid about people talking about her in her absence. Patient stated she does not know why she was in the hospital and she could not recall the name of the problem she came to the hospital etc.Patient seems to  be highly confused, poor historian and easily getting frustrated because she was not able to answer simple questions. Patient endorses drinking vodka but could not gave me the details or the pattern of drinking and Her last drink. Patient has a tangential and circumstantial thought process and continued to be talking extensively but not able to provide any's specific details of her Physical health problems, mental health Issues, and substance abuseproblems.  Past Psychiatric History: History of alcohol dependence, and has no [facility] admissions as per chart review.   Interval history: Patient seen today for psychiatric consultation follow-up.'s case discussed with staff RN who reported patient continued to be confused and not improved much since Friday. Reportedly patient has been compliant with her medication. Patient is a poor historian, difficult to remember and seems to be mild-to-moderate delirious state. Patient has no family members at bedside. Patient has poor orientation, concentration, memory functions and language intact but no abstract thinking. Patient has a fair insight but poor judgment. Patient denies suicidal/homicidal ideation.  Risk to Self: Is patient at risk for suicide?: No Risk to Others:   Prior Inpatient Therapy:   Prior Outpatient Therapy:    Past Medical History:  Past Medical History  Diagnosis Date  . Anemia   . Thyroid disease   . Alcohol abuse   . 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 Cholecystectomy    . Gastric bypass    . Ankle surgery     Family History:  Family History  Problem Relation Age of Onset  . Breast cancer Sister    Family Psychiatric  History: significant for alcohol abuse vs dependence. Social History:  History  Alcohol Use  . 12.0 oz/week  . 20 Standard drinks or equivalent per week     History  Drug Use  . Yes  . Special: Marijuana    Social History   Social History  . Marital  Status: Single    Spouse Name: N/A  . Number of Children: N/A  . Years of Education: N/A   Social History Main Topics  . Smoking status: Never Smoker   . Smokeless tobacco: Never Used  . Alcohol Use: 12.0 oz/week    20 Standard drinks or equivalent per week  . Drug Use: Yes    Special: Marijuana  . Sexual Activity: Not Currently   Other Topics Concern  . None   Social History Narrative   Additional Social History:    Allergies:  No Known Allergies  Labs:  Results for orders placed or performed during the hospital encounter of 08/12/15 (from the past 48 hour(s))  Magnesium     Status: Abnormal   Collection Time: 08/21/15 10:26 AM  Result Value Ref Range   Magnesium 1.6 (L) 1.7 - 2.4 mg/dL  Vancomycin, trough     Status: Abnormal   Collection Time: 08/21/15 12:35 PM  Result Value Ref Range   Vancomycin Tr 28 (HH) 10.0 - 20.0 ug/mL    Comment: CRITICAL RESULT CALLED TO, READ BACK BY AND VERIFIED WITH: SHAW,B AT 1357 ON 08/21/15 BY MOSLEY,J   Protime-INR     Status: Abnormal   Collection Time: 08/22/15  5:00 AM  Result Value Ref Range   Prothrombin Time 22.9 (H) 11.6 - 15.2 seconds   INR 2.04 (H) 0.00 - 3.38  Basic metabolic panel     Status: Abnormal   Collection Time: 08/22/15  5:00 AM  Result Value Ref Range   Sodium 142 135 - 145 mmol/L   Potassium 3.2 (L) 3.5 - 5.1 mmol/L   Chloride 114 (H) 101 - 111 mmol/L   CO2 21 (L) 22 - 32 mmol/L   Glucose, Bld 100 (H) 65 - 99 mg/dL   BUN 21 (H) 6 - 20 mg/dL   Creatinine, Ser 1.42 (H) 0.44 - 1.00 mg/dL   Calcium 8.0 (L) 8.9 - 10.3 mg/dL   GFR calc non Af Amer 43 (L) >60 mL/min   GFR calc Af Amer 49 (L) >60 mL/min    Comment: (NOTE) The eGFR has been calculated using the CKD EPI equation. This calculation has not been validated in all clinical situations. eGFR's persistently <60 mL/min signify possible Chronic Kidney Disease.    Anion gap 7 5 - 15  Protime-INR     Status: Abnormal   Collection Time: 08/23/15  6:00 AM   Result Value Ref Range   Prothrombin Time 23.6 (H) 11.6 - 15.2 seconds   INR 2.13 (H) 0.00 - 2.50  Basic metabolic panel     Status: Abnormal   Collection Time: 08/23/15  6:00 AM  Result Value Ref Range   Sodium 140 135 - 145 mmol/L   Potassium 3.7 3.5 - 5.1 mmol/L   Chloride 114 (H) 101 - 111 mmol/L   CO2 21 (L) 22 - 32 mmol/L   Glucose, Bld 106 (H) 65 - 99 mg/dL   BUN 19 6 - 20 mg/dL   Creatinine, Ser 1.44 (H) 0.44 - 1.00 mg/dL   Calcium 7.9 (L) 8.9 -  10.3 mg/dL   GFR calc non Af Amer 42 (L) >60 mL/min   GFR calc Af Amer 49 (L) >60 mL/min    Comment: (NOTE) The eGFR has been calculated using the CKD EPI equation. This calculation has not been validated in all clinical situations. eGFR's persistently <60 mL/min signify possible Chronic Kidney Disease.    Anion gap 5 5 - 15  Magnesium     Status: None   Collection Time: 08/23/15  6:00 AM  Result Value Ref Range   Magnesium 1.8 1.7 - 2.4 mg/dL    Current Facility-Administered Medications  Medication Dose Route Frequency Provider Last Rate Last Dose  . ceFEPIme (MAXIPIME) 1 g in dextrose 5 % 50 mL IVPB  1 g Intravenous Q12H Christine E Shade, RPH   1 g at 08/23/15 0227  . folic acid (FOLVITE) tablet 1 mg  1 mg Oral Daily Barton Dubois, MD   1 mg at 08/22/15 0956  . furosemide (LASIX) injection 40 mg  40 mg Intravenous BID Hosie Poisson, MD   40 mg at 08/23/15 0819  . guaiFENesin (MUCINEX) 12 hr tablet 600 mg  600 mg Oral BID Florencia Reasons, MD   600 mg at 08/22/15 2117  . lactulose (CHRONULAC) 10 GM/15ML solution 30 g  30 g Oral BID Florencia Reasons, MD   30 g at 08/22/15 2115  . levothyroxine (SYNTHROID, LEVOTHROID) tablet 50 mcg  50 mcg Oral QAC breakfast Barton Dubois, MD   50 mcg at 08/23/15 [phone]  . metoprolol tartrate (LOPRESSOR) tablet 12.5 mg  12.5 mg Oral BID Arnoldo Lenis, MD   12.5 mg at 08/22/15 2124  . ondansetron (ZOFRAN) tablet 4 mg  4 mg Oral Q6H PRN Barton Dubois, MD       Or  . ondansetron [facility]) injection 4 mg  4 mg  Intravenous Q6H PRN Barton Dubois, MD      . oxyCODONE (Oxy IR/ROXICODONE) immediate release tablet 5 mg  5 mg Oral Q8H PRN Barton Dubois, MD   5 mg at 08/20/15 1908  . pantoprazole (PROTONIX) EC tablet 40 mg  40 mg Oral BID Barton Dubois, MD   40 mg at 08/22/15 2117  . QUEtiapine (SEROQUEL) tablet 25 mg  25 mg Oral BID Ambrose Finland, MD   25 mg at 08/22/15 2117  . rifaximin (XIFAXAN) tablet 550 mg  550 mg Oral BID Florencia Reasons, MD   550 mg at 08/22/15 2117  . sodium bicarbonate tablet 650 mg  650 mg Oral BID Florencia Reasons, MD   650 mg at 08/22/15 2117  . sodium chloride flush (NS) 0.9 % injection 10-40 mL  10-40 mL Intracatheter Q12H Florencia Reasons, MD   10 mL at 08/17/15 1051  . sodium chloride flush (NS) 0.9 % injection 10-40 mL  10-40 mL Intracatheter PRN Florencia Reasons, MD   10 mL at 08/21/15 0645  . sodium chloride flush (NS) 0.9 % injection 10-40 mL  10-40 mL Intracatheter PRN Hosie Poisson, MD      . spironolactone (ALDACTONE) tablet 25 mg  25 mg Oral Daily Arnoldo Lenis, MD   25 mg at 08/22/15 0954  . thiamine (VITAMIN B-1) tablet 100 mg  100 mg Oral Daily Barton Dubois, MD   100 mg at 08/22/15 0954  . zolpidem (AMBIEN) tablet 5 mg  5 mg Oral QHS PRN Dianne Dun, NP   5 mg at 08/19/15 2111    Musculoskeletal: Strength & Muscle Tone: decreased Gait & Station: unable to stand  Patient leans: N/A  Psychiatric Specialty Exam: Review of Systems  Unable to perform ROS  Altered mental status, swollen legs and poor historian  Blood pressure 97/61, pulse 90, temperature 98.1 F (36.7 C), temperature source Oral, resp. rate 18, height 5' 8" (1.727 m), weight 91.5 kg (201 lb 11.5 oz), last menstrual period 10/21/2014, SpO2 99 %.Body mass index is 30.68 kg/(m^2).  General Appearance: Bizarre and Guarded  Eye Contact::  Fair  Speech:  Blocked and Clear and Coherent  Volume:  Normal  Mood:  Anxious  Affect:  Inappropriate and Labile  Thought Process:  Irrelevant and Tangential  Orientation:   Negative  Thought Content:  Hallucinations: Visual, Paranoid Ideation and Rumination  Suicidal Thoughts:  No  Homicidal Thoughts:  No  Memory:  Immediate;   Fair Recent;   Poor  Judgement:  Impaired  Insight:  Shallow  Psychomotor Activity:  Decreased  Concentration:  Fair  Recall:  Poor  Fund of Knowledge:Fair  Language: Fair  Akathisia:  No  Handed:  Right  AIMS (if indicated):     Assets:  Others:  deferred  ADL's:  Impaired  Cognition: Impaired,  Severe  Sleep:      Treatment Plan Summary: Patient Is confused due to hepatic encephalopathic Patient does not meet criteria for capacity to make her own medical decisions based on evaluation. Refer to the unit social worker to contact family members regarding medical care power of attorney. Increase Seroquel 25 mg 3 times daily for increased frustration, agitation and hallucinations  Appreciate psychiatric consultation and we sign off as of today Please contact [phone] or [phone] if needs further assistance   Disposition: Patient benefit from skilled nursing facility when medically stable. Patient does not meet criteria for psychiatric inpatient admission. Supportive therapy provided about ongoing stressors.  Durward Parcel., MD 08/23/2015 9:11 AM

## 2015-08-23 NOTE — Progress Notes (Signed)
PROGRESS NOTE  Rhonda Fuentes:[id number]:[id number] DOB: 05-23-1965 DOA: 08/12/2015 PCP: No PCP Per Patient  Brief Summary:  Rhonda Fuentes is a 50 y.o. female with PMH significant for alcoholic cirrhosis, hypothyroidism, chronic pain syndrome and recent admission due to PNA and encephalopathy (ad[facility] to [facility]). Patient presented from GI office due to ongoing jaundice, confusion, low BP and difficulty breathing. In ED found to have abnormal CXR (with new infiltrates component) and features of sepsis. Patient endorses swelling of her legs and abdomen.   She was hospitalized in [facility] on 4/6-4/10 and 4/24-4/26. Her sister  Rhonda Fuentes thinks she is prematurely discharged from prior hospitalization,   5/11 patient is more alert and oriented to place and person.  Her hallucinations have improved.  She is more pleasant and not angry. She still has bouts of confusion today, struggling to tell me where she is and trying to sound rational.  No new complaints today. Wants to get better before discharge.    HPI/Recap of past 24 hours:  Resting comfortably.  Assessment/Plan: Principal Problem:   Altered mental status Active Problems:   Alcoholic hepatitis with ascites   Pneumonia   Encephalopathy, hepatic (HCC)   Alcoholic cirrhosis (HCC)   Lactic acidosis   Hypothyroidism   GERD (gastroesophageal reflux disease)   Sepsis (HCC)   HCAP (healthcare-associated pneumonia)   Confusion  1-Sepsis (HCC): with concerns for HCAP  -patient recently admitted for PNA and hepatic encephalopathy -patient completed treatment according to instructions; but presented still with SOB, tachypnea, elevated WBC's and soft BP on admission. -patient found to have elevated lactic acid and CXR demonstrating new right lower lobe infiltrates. - sepsis protocol, admitted to stepdown;  lactic acid normalized,, moved to med/tele on 5/6 -Vanc and cefepime started from admission, then changed to vanc/rocephin/zithro, dropped  zithro due to frequent NSVT. Then changed to oral doxycycline on 5/7, repeat CT chest with persistent multifocal pna, restarted vanc/ cefepime. Stopped the vancomycin as her creatinine is starting to go up and the trough is 28. -poor iv access, patient pulled out piccline, she will need repeat PICC line placement. Which was placed on 5/11 and restarted her IV antibiotics( cefepime).     2-Alcoholic hepatitis/cirrhosis with ascites: -negative hepatitis panel, ascites cytology no malignant cells reported, CEA/afp negative, family reported patient took lots of tylenol for pain control after ankle surgery a few months ago, tylenol level low, + family history of alcohol. -s/p paracentesis, paritoneal fluids analysis:  wbc 72, gram stain no organism seen , no ab pain, no sbp. -GI was consulted To assist with treatment/care of her cirrhosis  - repeat ammonia level is normal.   3-confusion, hallucination, delirium, per chart review she has been having hallucination during last two hospitalization, confusion likely combination of alcohol withdrawal, wernicke's, infection, hepatic encephalopathy: -on lactulose, started rifaximin,  Her confusion has improved.  -ct head negative, psychiatry consulted for capacity eval, alcohol abuse, delirium, hallucination, family aware. She is deemed non competant to make decisions.  Discussed with the patient and her sister Rhonda Fuentes.   4-Hypothyroidism: -TSH 5.144 -on synthroid  5-GERD (gastroesophageal reflux disease): -continue PPI  6-chronic pain syndrome: - decrease/minimize narcotics currently given low BP and encephalopathy -has not required any pain meds in the hospital  7. INR elevated: likely from cirrhosis and sepsis, hold prophylaxis heparin, monitor INR  8. Bilateral lower extremity edema: likely from cirrhosis, venous us no DVT  9. Hypokalemia/hypomagnesemia: replace k/mag as needed.   10; frequent NSVT: echo adequate EF, no wall  motion  abnromalities, keep mag>2 and k>4, started betablocker. Not a candidate for amiodarone due to liver impairment. D/c zithromax. Correct mild acidosis with sodium bicarb tabs. Better, Cardiology consulted, recommedations given. .  11: cr elevated on 5/7, from vanc? Slightly supratherapeutic, from diuretics? From hypotension? Hepatorenal? Monitor. Her creatinine increased to 1.57, if it continues to increase, will get UA, and US renal.  will probably need further diuresis, as she appears fluid overloaded. Increased lasix to 40 mg BID. Creatinine is around 1.4 today. Urine output is not being measured. But she reports good diuresis. Would continue the lasix BID as we have room for diuresis.   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  DVT prophylaxis: scd's Code Status: Full code Family Communication: none at bedside.  Disposition Plan: pending PT consult. Discussed with sister Rhonda Fanning who is in [place].  Consults called:  [facility] for cirrhosis and related conditions Cardiology for NSVT   Procedures:  US guided paracentesis on 5/4  Antibiotics:  Vanc from admission to 5/7, restarted from 5/8  cefepime from admission to 5/6 , restarted on 5/8  Rocephin/zithro from 5/6 to 5/7  Doxycycline from 5/7 to 5/8.   Objective: BP 100/62 mmHg  Pulse 95  Temp(Src) 98.1 F (36.7 C) (Oral)  Resp 20  Ht  (1.727 m)  Wt 91.5 kg (201 lb 11.5 oz)  BMI 30.68 kg/m2  SpO2 100%  LMP 10/21/2014 (Approximate)  Intake/Output Summary (Last 24 hours) at 08/23/15 1857 Last data filed at 08/23/15 1500  Gross per 24 hour  Intake    340 ml  Output      0 ml  Net    340 ml   Filed Weights   08/20/15 0513 08/22/15 0551 08/23/15 0559  Weight: 96.9 kg (213 lb 10 oz) 93 kg (205 lb 0.4 oz) 91.5 kg (201 lb 11.5 oz)    Exam:   General:  Fail, slightly confused, no hallucinations today.   Cardiovascular: RRR  Respiratory: improved aeration, no wheezing  Abdomen: less distended, but  soft, NT, positive BS  Musculoskeletal:diffuse 3+bilateral lower extremity Edema  Neuro: slight confusion,  Data Reviewed: Basic Metabolic Panel:  Recent Labs Lab 08/19/15 0416 08/20/15 0215 08/21/15 0640 08/21/15 1026 08/22/15 0500 08/23/15 0600  NA 143 141 138  --  142 140  K 3.6 3.6 2.9*  --  3.2* 3.7  CL 115* 113* 110  --  114* 114*  CO2 20* 19* 18*  --  21* 21*  GLUCOSE 123* 117* 104*  --  100* 106*  BUN 22* 22* 20  --  21* 19  CREATININE 1.57* 1.45* 1.56*  --  1.42* 1.44*  CALCIUM 8.3* 8.2* 8.0*  --  8.0* 7.9*  MG  --   --   --  1.6*  --  1.8   Liver Function Tests:  Recent Labs Lab 08/17/15 0511  AST 166*  ALT 45  ALKPHOS 110  BILITOT 6.2*  PROT 5.1*  ALBUMIN 2.2*   No results for input(s): LIPASE, AMYLASE in the last 168 hours.  Recent Labs Lab 08/17/15 0511 08/19/15 0416  AMMONIA 30 18   CBC:  Recent Labs Lab 08/17/15 0511 08/19/15 0416 08/20/15 0215  WBC 16.0* 10.9* 13.2*  HGB 10.7* 10.5* 10.5*  HCT 30.3* 30.9* 30.0*  MCV 106.7* 108.0* 106.8*  PLT 103* 64* 138*   Cardiac Enzymes:   No results for input(s): CKTOTAL, CKMB, CKMBINDEX, TROPONINI in the last 168 hours. BNP (last 3 results) No results for input(s):  BNP in the last 8760 hours.  ProBNP (last 3 results) No results for input(s): PROBNP in the last 8760 hours.  CBG: No results for input(s): GLUCAP in the last 168 hours.  No results found for this or any previous visit (from the past 240 hour(s)).   Studies: No results found.  Scheduled Meds: . ceFEPime (MAXIPIME) IV  1 g Intravenous Q12H  . folic acid  1 mg Oral Daily  . furosemide  40 mg Intravenous BID  . guaiFENesin  600 mg Oral BID  . lactulose  30 g Oral BID  . levothyroxine  50 mcg Oral QAC breakfast  . metoprolol tartrate  12.5 mg Oral BID  . pantoprazole  40 mg Oral BID  . QUEtiapine  25 mg Oral TID  . rifaximin  550 mg Oral BID  . sodium bicarbonate  650 mg Oral BID  . sodium chloride flush  10-40 mL  Intracatheter Q12H  . spironolactone  25 mg Oral Daily  . thiamine  100 mg Oral Daily    Continuous Infusions:     Time spent:  Lunetta Marina MD, PhD  [facility] Pager [phone] If 7PM-7AM, please contact night-coverage at [email or web address], password [facility] 08/23/2015, 6:57 PM  LOS: 11 days

## 2015-08-24 ENCOUNTER — Inpatient Hospital Stay (HOSPITAL_COMMUNITY): Payer: BLUE CROSS/BLUE SHIELD

## 2015-08-24 LAB — BASIC METABOLIC PANEL
ANION GAP: 9 (ref 5–15)
BUN: 19 mg/dL (ref 6–20)
CHLORIDE: 109 mmol/L (ref 101–111)
CO2: 21 mmol/L — AB (ref 22–32)
CREATININE: 1.42 mg/dL — AB (ref 0.44–1.00)
Calcium: 8 mg/dL — ABNORMAL LOW (ref 8.9–10.3)
GFR calc non Af Amer: 43 mL/min — ABNORMAL LOW (ref >60)
GFR, EST AFRICAN AMERICAN: 49 mL/min — AB (ref >60)
Glucose, Bld: 104 mg/dL — ABNORMAL HIGH (ref 65–99)
Potassium: 3.3 mmol/L — ABNORMAL LOW (ref 3.5–5.1)
Sodium: 139 mmol/L (ref 135–145)

## 2015-08-24 LAB — AMMONIA: AMMONIA: 54 umol/L — AB (ref 9–35)

## 2015-08-24 LAB — PROTIME-INR
INR: 1.93 — ABNORMAL HIGH (ref 0.00–1.49)
Prothrombin Time: 22 seconds — ABNORMAL HIGH (ref 11.6–15.2)

## 2015-08-24 MED ORDER — VITAMINS A & D EX OINT
TOPICAL_OINTMENT | CUTANEOUS | Status: AC
  Administered 2015-08-24: 11:00:00
  Filled 2015-08-24: qty 5

## 2015-08-24 MED ORDER — OXYCODONE HCL 5 MG PO TABS: 5.0000 mg | ORAL_TABLET | Freq: Four times a day (QID) | ORAL | Status: DC | PRN

## 2015-08-24 MED ORDER — FUROSEMIDE 40 MG PO TABS
40.0000 mg | ORAL_TABLET | Freq: Every day | ORAL | Status: DC
  Administered 2015-08-24 – 2015-08-26 (×3): 40 mg via ORAL
  Filled 2015-08-24 (×4): qty 1

## 2015-08-24 MED ORDER — FUROSEMIDE 40 MG PO TABS: 40.0000 mg | ORAL_TABLET | Freq: Every day | ORAL | Status: DC

## 2015-08-24 MED ORDER — METOPROLOL TARTRATE 25 MG PO TABS: 12.5000 mg | ORAL_TABLET | Freq: Two times a day (BID) | ORAL | Status: DC

## 2015-08-24 MED ORDER — POTASSIUM CHLORIDE CRYS ER 20 MEQ PO TBCR
40.0000 meq | EXTENDED_RELEASE_TABLET | Freq: Two times a day (BID) | ORAL | Status: AC
  Administered 2015-08-24: 40 meq via ORAL
  Filled 2015-08-24 (×2): qty 2

## 2015-08-24 MED ORDER — RIFAXIMIN 550 MG PO TABS: 550.0000 mg | ORAL_TABLET | Freq: Two times a day (BID) | ORAL | Status: AC

## 2015-08-24 MED ORDER — QUETIAPINE FUMARATE 25 MG PO TABS
25.0000 mg | ORAL_TABLET | Freq: Three times a day (TID) | ORAL | Status: DC
Start: 2015-08-24 — End: 2015-08-27

## 2015-08-24 NOTE — Evaluation (Addendum)
Physical Therapy Evaluation-1x Patient Details Name: Rhonda Fuentes MRN: [id number] DOB: March 06, 1966 Today's Date: 08/24/2015   History of Present Illness  50 y.o. female with h/o liver cirrhosis, hypothyroid, GERD, chronic pain admitted with jaundice, confusion, hypotension, SOB. Dx of alcoholic hepatitis, PNA, encephalopathy, s/p paracentesis 08/13/15.  Clinical Impression  On eval, pt was Mod Ind with mobility-walked ~1000 feet in hallway. No LOB during session. Pt tolerated distance well. No further acute PT needs. 1x eval. Will sign off. (2nd eval this hospital stay-evaluated on 5/7-please see that eval as well).    Follow Up Recommendations No PT follow up    Equipment Recommendations  None recommended by PT    Recommendations for Other Services       Precautions / Restrictions Precautions Precautions: Fall Precaution Comments: pt reports 1 fall in past 1 year (tripped last summer) Restrictions Weight Bearing Restrictions: No      Mobility  Bed Mobility Overal bed mobility: Independent                Transfers Overall transfer level: Independent                  Ambulation/Gait Ambulation/Gait assistance: Modified independent (Device/Increase time) Ambulation Distance (Feet): 1000 Feet   Gait Pattern/deviations: Wide base of support;Step-through pattern     General Gait Details: slow gait speed. No LOB. No c/o dizziness/lightheadedness  Stairs            Wheelchair Mobility    Modified Rankin (Stroke Patients Only)       Balance Overall balance assessment: Needs assistance         Standing balance support: During functional activity Standing balance-Leahy Scale: Good                               Pertinent Vitals/Pain Pain Assessment: No/denies pain    Home Living Family/patient expects to be discharged to:: Private residence Living Arrangements: Spouse/significant other Available Help at Discharge: Family Type of  Home: Apartment Home Access: Stairs to enter Entrance Stairs-Rails: Can reach both Entrance Stairs-Number of Steps: 2 flights Home Layout: One level Home Equipment: Shower seat - built in      Prior Function Level of Independence: Independent         Comments: Drives and independent with ADLs/IADLs     Hand Dominance   Dominant Hand: Right    Extremity/Trunk Assessment   Upper Extremity Assessment: Overall WFL for tasks assessed           Lower Extremity Assessment:  (edema bil LEs)      Cervical / Trunk Assessment: Normal  Communication   Communication: No difficulties  Cognition Arousal/Alertness: Awake/alert Behavior During Therapy: WFL for tasks assessed/performed Overall Cognitive Status: Within Functional Limits for tasks assessed                      General Comments      Exercises        Assessment/Plan    PT Assessment Patent does not need any further PT services  PT Diagnosis     PT Problem List    PT Treatment Interventions     PT Goals (Current goals can be found in the Care Plan section) Acute Rehab PT Goals Patient Stated Goal: to go home  PT Goal Formulation: All assessment and education complete, DC therapy    Frequency     Barriers to discharge  Co-evaluation               End of Session Equipment Utilized During Treatment: Gait belt Activity Tolerance: Patient tolerated treatment well Patient left: in bed;with call bell/phone within reach;with bed alarm set;with nursing/sitter in room           Time: 0865-7846 PT Time Calculation (min) (ACUTE ONLY): 18 min   Charges:   PT Evaluation $PT Eval Low Complexity: 1 Procedure     PT G Codes:        Rebeca Alert, MPT Pager: [phone]

## 2015-08-24 NOTE — Progress Notes (Signed)
PROGRESS NOTE  Rhonda Fuentes ZOX:[id number] DOB: Aug 17, 1965 DOA: 08/12/2015 PCP: No PCP Per Patient  Brief Summary:  Rhonda Bitterrinia Hogge is a 50 y.o. female with PMH significant for alcoholic cirrhosis, hypothyroidism, chronic pain syndrome and recent admission due to PNA and encephalopathy ([facility] Patient presented from GI office due to ongoing jaundice, confusion, low BP and difficulty breathing. In ED found to have abnormal CXR (with new infiltrates component) and features of sepsis. She was admitted for evaluation and management of health care associated pneumonia and hepatic encephalopathy.  She has completed 7 days of IV antibiotics .  She is more pleasant and not angry. She still has bouts of confusion today, struggling to tell me where she is and trying to sound rational.     HPI/Recap of past 24 hours:  Resting comfortably. Does nt want to talk to me.  Assessment/Plan: Principal Problem:   Altered mental status Active Problems:   Alcoholic hepatitis with ascites   Pneumonia   Encephalopathy, hepatic (HCC)   Alcoholic cirrhosis (HCC)   Lactic acidosis   Hypothyroidism   GERD (gastroesophageal reflux disease)   Sepsis (HCC)   HCAP (healthcare-associated pneumonia)   Confusion  1-Sepsis (HCC): with concerns for HCAP  -patient recently admitted for PNA and hepatic encephalopathy -patient completed treatment according to instructions; but presented still with SOB, tachypnea, elevated WBC's and soft BP on admission. -patient found to have elevated lactic acid and CXR demonstrating new right lower lobe infiltrates. - sepsis protocol, admitted to stepdown;  lactic acid normalized,, moved to med/tele on 5/6 -Vanc and cefepime started from admission, then changed to vanc/rocephin/zithro, dropped zithro due to frequent NSVT. Then changed to oral doxycycline on 5/7, repeat CT chest with persistent multifocal pna, restarted vanc/ cefepime. Stopped the vancomycin as her creatinine  is starting to go up and the trough is 28. - her cefepime was discontinued on 5/16 as she completed about 7 days of IV antibiotics.  - repeat CXR ordered for evaluation of resolution of the pneumonia.  - her oxygen sats on RA are greater than 90% - her blood cultures have been negative so far.     2-Alcoholic hepatitis/cirrhosis with ascites: -negative hepatitis panel, ascites cytology no malignant cells reported, CEA/afp negative, family reported patient took lots of tylenol for pain control after ankle surgery a few months ago, tylenol level low on admission, + family history of alcohol. -s/p paracentesis, paritoneal fluids analysis:  wbc 72, gram stain no organism seen , no ab pain, no sbp. -GI was consulted To assist with treatment/care of her cirrhosis, suggested to continue lactulose and rifaximin and lactulose , and nothing to add from their perspective.   - in view of her worsening confusion yesterday, we have repeated ammonia levels and are pending.   3- Acute on chronic hepatic encephalopathy: confusion, hallucination, delirium, per chart review she has been having hallucination during last two hospitalization, confusion likely combination of alcohol withdrawal, wernicke's, infection, hepatic encephalopathy: -on lactulose, started rifaximin,. -ct head negative, psychiatry consulted for capacity eval, alcohol abuse, delirium, hallucination, family aware. She is deemed non competant to make decisions.  Discussed with the patient and her sister [name] and her partner and we are trying to decide on safe discharge plans.  Family wants her to go to a skilled nursing facility.  Repeat PT/OT evaluations ordered.   4-Hypothyroidism: -TSH 5.144 -on synthroid  5-GERD (gastroesophageal reflux disease): -continue PPI  6-chronic pain syndrome: - decrease/minimize narcotics currently given low BP and encephalopathy -  has not required any pain meds in the hospital  7. INR elevated: likely  from cirrhosis and sepsis, hold prophylaxis heparin, monitor INR  8. Bilateral lower extremity edema: likely from cirrhosis, venous US no DVT  9. Hypokalemia/hypomagnesemia: replace k/mag as needed. Repeat in am.   10; frequent NSVT: echo adequate EF, no wall motion abnromalities, keep mag>2 and k>4, started betablocker. Not a candidate for amiodarone due to liver impairment. D/c zithromax.  Better, Cardiology consulted, recommedations given. .  11:Acute kidney injury : from diuretics? From hypotension? Hepatorenal?  Her creatinine increased to 1.57 on 5/11  And has been around 1.4 with good diuresis.  We have decreased the lasix to 40 mg daily on 5/16.  Resume spironolactone.  UA and urine cultures are negative.  Monitor renal parameters in am.    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   13. Anemia: macrocytic. Stable around 10.   DVT prophylaxis: scd's Code Status: Full code Family Communication: none at bedside.  Disposition Plan: pending PT, OT consult. Discussed with sister [name] and her partner over the phone.  On 5/ 11, 5/16 Consults called:  [facility] for cirrhosis and related conditions Cardiology for NSVT   Procedures:  US guided paracentesis on 5/4  Antibiotics:  Vanc from admission to 5/7, restarted from 5/8 till 5/13  cefepime from admission to 5/6 , restarted on 5/8 till 5/16  Rocephin/zithro from 5/6 to 5/7  Doxycycline from 5/7 to 5/8.   Objective: BP 115/54 mmHg  Pulse 99  Temp(Src) 97.4 F (36.3 C) (Axillary)  Resp 16  Ht 5\' 8"  (1.727 m)  Wt 92.9 kg (204 lb 12.9 oz)  BMI 31.15 kg/m2  SpO2 100%  LMP 10/21/2014 (Approximate)  Intake/Output Summary (Last 24 hours) at 08/24/15 1220 Last data filed at 08/24/15 0933  Gross per 24 hour  Intake    950 ml  Output    350 ml  Net    600 ml   Filed Weights   08/22/15 0551 08/23/15 0559 08/24/15 0500  Weight: 93 kg (205 lb 0.4 oz) 91.5 kg (201 lb 11.5 oz) 92.9 kg (204 lb 12.9  oz)    Exam:   General:  Confused, agitated   Cardiovascular: RRR, no murmurs heard.   Respiratory: improved aeration, no wheezing  Abdomen: less distended, but soft, NT, positive BS  Musculoskeletal:diffuse 3+bilateral lower extremity Edema  Neuro: slight confusion, able to move all extremities.   Data Reviewed: Basic Metabolic Panel:  Recent Labs Lab 08/20/15 0215 08/21/15 0640 08/21/15 1026 08/22/15 0500 08/23/15 0600 08/24/15 0435  NA 141 138  --  142 140 139  K 3.6 2.9*  --  3.2* 3.7 3.3*  CL 113* 110  --  114* 114* 109  CO2 19* 18*  --  21* 21* 21*  GLUCOSE 117* 104*  --  100* 106* 104*  BUN 22* 20  --  21* 19 19  CREATININE 1.45* 1.56*  --  1.42* 1.44* 1.42*  CALCIUM 8.2* 8.0*  --  8.0* 7.9* 8.0*  MG  --   --  1.6*  --  1.8  --    Liver Function Tests: No results for input(s): AST, ALT, ALKPHOS, BILITOT, PROT, ALBUMIN in the last 168 hours. No results for input(s): LIPASE, AMYLASE in the last 168 hours.  Recent Labs Lab 08/19/15 0416  AMMONIA 18   CBC:  Recent Labs Lab 08/19/15 0416 08/20/15 0215  WBC 10.9* 13.2*  HGB 10.5* 10.5*  HCT 30.9* 30.0*  MCV  108.0* 106.8*  PLT 64* 138*   Cardiac Enzymes:   No results for input(s): CKTOTAL, CKMB, CKMBINDEX, TROPONINI in the last 168 hours. BNP (last 3 results) No results for input(s): BNP in the last 8760 hours.  ProBNP (last 3 results) No results for input(s): PROBNP in the last 8760 hours.  CBG: No results for input(s): GLUCAP in the last 168 hours.  No results found for this or any previous visit (from the past 240 hour(s)).   Studies: No results found.  Scheduled Meds: . folic acid  1 mg Oral Daily  . furosemide  40 mg Oral Daily  . guaiFENesin  600 mg Oral BID  . lactulose  30 g Oral BID  . levothyroxine  50 mcg Oral QAC breakfast  . metoprolol tartrate  12.5 mg Oral BID  . pantoprazole  40 mg Oral BID  . potassium chloride  40 mEq Oral BID  . QUEtiapine  25 mg Oral TID  .  rifaximin  550 mg Oral BID  . sodium bicarbonate  650 mg Oral BID  . sodium chloride flush  10-40 mL Intracatheter Q12H  . spironolactone  25 mg Oral Daily  . thiamine  100 mg Oral Daily    Continuous Infusions:     Time spent: 45 mins  Lorretta Kerce MD, PhD  [facility] Pager [phone] If 7PM-7AM, please contact night-coverage at [email or web address], password [facility] 08/24/2015, 12:20 PM  LOS: 12 days

## 2015-08-24 NOTE — Plan of Care (Signed)
Problem: Activity: Goal: Risk for activity intolerance will decrease Outcome: Progressing Pt walked with sitter 2-3 times around floor. Pt tolerated well. Encouraged pt to continue ambulation.

## 2015-08-25 DIAGNOSIS — K7031 Alcoholic cirrhosis of liver with ascites: Secondary | ICD-10-CM

## 2015-08-25 DIAGNOSIS — K219 Gastro-esophageal reflux disease without esophagitis: Secondary | ICD-10-CM

## 2015-08-25 DIAGNOSIS — K7290 Hepatic failure, unspecified without coma: Secondary | ICD-10-CM

## 2015-08-25 LAB — COMPREHENSIVE METABOLIC PANEL
ALBUMIN: 2 g/dL — AB (ref 3.5–5.0)
ALK PHOS: 83 U/L (ref 38–126)
ALT: 25 U/L (ref 14–54)
AST: 65 U/L — AB (ref 15–41)
Anion gap: 6 (ref 5–15)
BILIRUBIN TOTAL: 5 mg/dL — AB (ref 0.3–1.2)
BUN: 18 mg/dL (ref 6–20)
CALCIUM: 7.9 mg/dL — AB (ref 8.9–10.3)
CO2: 23 mmol/L (ref 22–32)
CREATININE: 1.41 mg/dL — AB (ref 0.44–1.00)
Chloride: 111 mmol/L (ref 101–111)
GFR calc Af Amer: 50 mL/min — ABNORMAL LOW (ref >60)
GFR, EST NON AFRICAN AMERICAN: 43 mL/min — AB (ref >60)
GLUCOSE: 95 mg/dL (ref 65–99)
POTASSIUM: 3.6 mmol/L (ref 3.5–5.1)
Sodium: 140 mmol/L (ref 135–145)
TOTAL PROTEIN: 4.5 g/dL — AB (ref 6.5–8.1)

## 2015-08-25 LAB — PROTIME-INR
INR: 2.09 — AB (ref 0.00–1.49)
Prothrombin Time: 23.3 seconds — ABNORMAL HIGH (ref 11.6–15.2)

## 2015-08-25 LAB — CBC
HEMATOCRIT: 25.7 % — AB (ref 36.0–46.0)
HEMOGLOBIN: 8.8 g/dL — AB (ref 12.0–15.0)
MCH: 35.5 pg — ABNORMAL HIGH (ref 26.0–34.0)
MCHC: 34.2 g/dL (ref 30.0–36.0)
MCV: 103.6 fL — AB (ref 78.0–100.0)
Platelets: 90 10*3/uL — ABNORMAL LOW (ref 150–400)
RBC: 2.48 MIL/uL — ABNORMAL LOW (ref 3.87–5.11)
RDW: 14.6 % (ref 11.5–15.5)
WBC: 7.4 10*3/uL (ref 4.0–10.5)

## 2015-08-25 LAB — MAGNESIUM: MAGNESIUM: 1.6 mg/dL — AB (ref 1.7–2.4)

## 2015-08-25 MED ORDER — QUETIAPINE FUMARATE 25 MG PO TABS
25.0000 mg | ORAL_TABLET | Freq: Two times a day (BID) | ORAL | Status: DC
  Administered 2015-08-25 – 2015-08-27 (×5): 25 mg via ORAL
  Filled 2015-08-25 (×5): qty 1

## 2015-08-25 NOTE — Progress Notes (Addendum)
PROGRESS NOTE  Rhonda Fuentes ZOX:[id number] DOB: [birth year] DOA: 08/12/2015 PCP: No PCP Per Patient  Brief Summary:  Rhonda Fuentes is a 50 y.o. female with PMH significant for alcoholic cirrhosis, hypothyroidism, chronic pain syndrome and recent admission due to PNA and encephalopathy (admitted to [facility]). Patient presented from GI office due to ongoing jaundice, confusion, low BP and difficulty breathing. In ED found to have abnormal CXR (with new infiltrates component) and features of sepsis. She was admitted for evaluation and management of health care associated pneumonia and hepatic encephalopathy.  She has completed 7 days of IV antibiotics .  Mentation and hallucinations, fluctuated, seen by Psych, started on seroquel  Subjective: intermittent confusion, no events overnight  Assessment/Plan: 1-Sepsis (HCC): due to HCAP  -recently admitted for PNA and hepatic encephalopathy at [facility] last month -On admission found to have elevated lactic acid and CXR demonstrating new right lower lobe infiltrates. -Vanc and cefepime started on admission, then changed to vanc/rocephin/zithro, dropped zithro due to frequent NSVT. Then changed to oral doxycycline on 5/7, repeat CT chest with persistent multifocal pna, restarted vanc/ cefepime. Stopped the vancomycin as her creatinine starting to go up and the trough was 28. - her cefepime was discontinued on 5/16 after she completed about 7 days of IV antibiotics.  - repeat CXR-improved  - her blood cultures have been negative so far.  -sepsis resolved, now stable off Abx   2-Alcoholic hepatitis/cirrhosis with ascites: -negative hepatitis panel, ascites cytology no malignant cells reported, CEA/afp negative, family reported patient took lots of tylenol for pain control after ankle surgery a few months ago, tylenol level low on admission, heavy ETOH use -s/p paracentesis, paritoneal fluids analysis:  wbc 72, gram stain no organism seen , hence No SBP -[facility]  was consulted:recommended to continue lactulose and rifaximin , and nothing else to add from their perspective.   - she was also treated with steroids at [facility] last month for Alcoholic hepatitis  3- Encephalopathy: confusion, hallucination, delirium, per chart review she has been having hallucination during last two hospitalization, confusion likely combination of alcohol withdrawal, wernicke's, infection, hepatic encephalopathy, Pranoia. -on lactulose, started rifaximin,. -ct head negative, psychiatry consulted for capacity eval, alcohol abuse, delirium, hallucination, family aware. She is deemed non competant to make decisions, started on low dose Seroquel by Psych. Dr.Akula, discussed with the patient and her sister Rhonda Fuentes and her partner and trying to decide on safe discharge plans.  Family wants her to go to a skilled nursing facility, according to PT, SNF not recommended, she needs supervision   4-Hypothyroidism: -TSH 5.144 -on synthroid  5-GERD (gastroesophageal reflux disease): -continue PPI  6-chronic pain syndrome: - decrease/minimize narcotics due to mentation -has not required much narcotics in the hospital  7. INR elevated: -due to coagulopathy from sepsis  8. Bilateral lower extremity edema: likely from cirrhosis, venous US no DVT -continue lasix and aldactone  9. Hypokalemia/hypomagnesemia: replace k/mag as needed.   10; frequent NSVT: echo adequate EF, no wall motion abnromalities, keep mag>2 and k>4, started betablocker. Not a candidate for amiodarone due to liver impairment. stopped zithromax.  Resolved, Cardiology consulted, recommedations given. .  11:Acute kidney injury : Due to Vanc toxicity, ? 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  13. Anemia: macrocytic. Stable around 10.   DVT prophylaxis: scd's Code  Status: Full code Family Communication: none at bedside.  Disposition  Plan: unable to determine safe DC plan, will d/w CSW and CM, Dr.Akula d/w sister julie and her partner over the phone.  On 5/ 11, 5/16 Consults called: [facility] for cirrhosis, Cardiology for NSVT   Procedures:  US guided paracentesis on 5/4  Antibiotics:  Vanc from admission to 5/7, restarted from 5/8 till 5/13  cefepime from admission to 5/6 , restarted on 5/8 till 5/16  Rocephin/zithro from 5/6 to 5/7  Doxycycline from 5/7 to 5/8.   Objective: BP 117/83 mmHg  Pulse 92  Temp(Src) 97.9 F (36.6 C) (Oral)  Resp 16  Ht  (1.727 m)  Wt 92.9 kg (204 lb 12.9 oz)  BMI 31.15 kg/m2  SpO2 98%  LMP 10/21/2014 (Approximate)  Intake/Output Summary (Last 24 hours) at 08/25/15 1307 Last data filed at 08/25/15 0900  Gross per 24 hour  Intake    180 ml  Output    175 ml  Net      5 ml   Filed Weights   08/22/15 0551 08/23/15 0559 08/24/15 0500  Weight: 93 kg (205 lb 0.4 oz) 91.5 kg (201 lb 11.5 oz) 92.9 kg (204 lb 12.9 oz)    Exam:   General:  Alert, awake, oriented to place and person, some random words  Cardiovascular: RRR, no murmurs heard.   Respiratory: improved aeration, no wheezing  Abdomen: soft, non distended, NT, positive BS  Musculoskeletal: 2plus edema  Neuro: slight confusion, non focal exam  Data Reviewed: Basic Metabolic Panel:  Recent Labs Lab 08/21/15 0640 08/21/15 1026 08/22/15 0500 08/23/15 0600 08/24/15 0435 08/25/15 0422  NA 138  --  142 140 139 140  K 2.9*  --  3.2* 3.7 3.3* 3.6  CL 110  --  114* 114* 109 111  CO2 18*  --  21* 21* 21* 23  GLUCOSE 104*  --  100* 106* 104* 95  BUN 20  --  21* CREATININE 1.56*  --  1.42* 1.44* 1.42* 1.41*  CALCIUM 8.0*  --  8.0* 7.9* 8.0* 7.9*  MG  --  1.6*  --  1.8  --  1.6*   Liver Function Tests:  Recent Labs Lab 08/25/15 0422  AST 65*  ALT 25  ALKPHOS 83  BILITOT 5.0*  PROT 4.5*  ALBUMIN 2.0*   No  results for input(s): LIPASE, AMYLASE in the last 168 hours.  Recent Labs Lab 08/19/15 0416 08/24/15 1249  AMMONIA 18 54*   CBC:  Recent Labs Lab 08/19/15 0416 08/20/15 0215 08/25/15 0422  WBC 10.9* 13.2* 7.4  HGB 10.5* 10.5* 8.8*  HCT 30.9* 30.0* 25.7*  MCV 108.0* 106.8* 103.6*  PLT 64* 138* 90*   Cardiac Enzymes:   No results for input(s): CKTOTAL, CKMB, CKMBINDEX, TROPONINI in the last 168 hours. BNP (last 3 results) No results for input(s): BNP in the last 8760 hours.  ProBNP (last 3 results) No results for input(s): PROBNP in the last 8760 hours.  CBG: No results for input(s): GLUCAP in the last 168 hours.  No results found for this or any previous visit (from the past 240 hour(s)).   Studies: Dg Chest 2 View  08/24/2015  CLINICAL DATA:  Follow-up pneumonia. EXAM: CHEST  2 VIEW COMPARISON:  08/16/2015 chest CT. 08/15/2015 and prior chest radiographs. FINDINGS: The cardiomediastinal silhouette is unremarkable. Bilateral airspace opacities have significantly decreased. Peribronchial thickening in subsegmental atelectasis/scarring within the mid lungs again noted. A right PICC line is again identified with tip overlying the superior  cavoatrial junction. No new airspace disease, pleural effusion or pneumothorax noted. IMPRESSION: Significantly improved bilateral airspace opacities/pneumonia. Electronically Signed   By: Harmon PierJeffrey  Hu M.D.   On: 08/24/2015 15:02    Scheduled Meds: . folic acid  1 mg Oral Daily  . furosemide  40 mg Oral Daily  . guaiFENesin  600 mg Oral BID  . lactulose  30 g Oral BID  . levothyroxine  50 mcg Oral QAC breakfast  . metoprolol tartrate  12.5 mg Oral BID  . pantoprazole  40 mg Oral BID  . QUEtiapine  25 mg Oral BID  . rifaximin  550 mg Oral BID  . sodium bicarbonate  650 mg Oral BID  . sodium chloride flush  10-40 mL Intracatheter Q12H  . spironolactone  25 mg Oral Daily  . thiamine  100 mg Oral Daily    Continuous Infusions:      Time spent: 35 mins  Arneda Sappington MD  [facility] Pager [phone] If 7PM-7AM, please contact night-coverage at [email or web address], password [email or web address] 08/25/2015, 1:07 PM  LOS: 13 days

## 2015-08-25 NOTE — Progress Notes (Signed)
OT Cancellation Note/Screen  Patient Details Name: Rhonda Fuentes MRN: [id number] DOB: Nov 13, 1965   Cancelled Treatment:    Reason Eval/Treat Not Completed: Other (comment).  Noted, pt was mod I with ambulation.  I spoke to RN and NT and they reported that pt completed her own shower today.  Per MD note, d/c plan is for supervision.    Rhonda Fuentes 08/25/2015, 4:06 PM  Marica OtterMaryellen Jeiden Fuentes, OTR/L [phone] 08/25/2015

## 2015-08-25 NOTE — Progress Notes (Signed)
Patient refusing bedtime medications. Had to call patient's boyfriend Lorin PicketScott and spent over 45 min in room with patient while boyfriend convinced her over the phone to take her medications.  Patient still refused to take po potassium, Xifaxan, and Mucinex. Will continue to monitor patient.

## 2015-08-26 DIAGNOSIS — R4182 Altered mental status, unspecified: Secondary | ICD-10-CM

## 2015-08-26 LAB — CBC
HCT: 28.2 % — ABNORMAL LOW (ref 36.0–46.0)
Hemoglobin: 9.7 g/dL — ABNORMAL LOW (ref 12.0–15.0)
MCH: 35.4 pg — ABNORMAL HIGH (ref 26.0–34.0)
MCHC: 34.4 g/dL (ref 30.0–36.0)
MCV: 102.9 fL — ABNORMAL HIGH (ref 78.0–100.0)
Platelets: 107 10*3/uL — ABNORMAL LOW (ref 150–400)
RBC: 2.74 MIL/uL — ABNORMAL LOW (ref 3.87–5.11)
RDW: 14.5 % (ref 11.5–15.5)
WBC: 9 10*3/uL (ref 4.0–10.5)

## 2015-08-26 LAB — BASIC METABOLIC PANEL WITH GFR
Anion gap: 5 (ref 5–15)
BUN: 17 mg/dL (ref 6–20)
CO2: 22 mmol/L (ref 22–32)
Calcium: 8.1 mg/dL — ABNORMAL LOW (ref 8.9–10.3)
Chloride: 111 mmol/L (ref 101–111)
Creatinine, Ser: 1.36 mg/dL — ABNORMAL HIGH (ref 0.44–1.00)
GFR calc Af Amer: 52 mL/min — ABNORMAL LOW
GFR calc non Af Amer: 45 mL/min — ABNORMAL LOW
Glucose, Bld: 95 mg/dL (ref 65–99)
Potassium: 3.5 mmol/L (ref 3.5–5.1)
Sodium: 138 mmol/L (ref 135–145)

## 2015-08-26 LAB — PROTIME-INR
INR: 2.08 — ABNORMAL HIGH (ref 0.00–1.49)
Prothrombin Time: 23.3 s — ABNORMAL HIGH (ref 11.6–15.2)

## 2015-08-26 NOTE — Progress Notes (Signed)
PROGRESS NOTE  Meridian Scherger ZOX:[id number] DOB: 04/04/1966 DOA: 08/12/2015 PCP: No PCP Per Patient  Brief Summary:  Rhonda Fuentes is a 50 y.o. female with PMH significant for alcoholic cirrhosis, hypothyroidism, chronic pain syndrome and recent admission due to PNA and encephalopathy (admitted to [facility]). Patient presented from GI office due to ongoing jaundice, confusion, low BP and difficulty breathing. In ED found to have abnormal CXR (with new infiltrates component) and features of sepsis. She was admitted for evaluation and management of health care associated pneumonia and hepatic encephalopathy.  She has completed 7 days of IV antibiotics .  Mentation and hallucinations, fluctuated, seen by Psych, started on seroquel  Subjective: intermittent confusion, no events overnight  Assessment/Plan: 1-Sepsis (HCC): due to HCAP  -recently admitted for PNA and hepatic encephalopathy at [facility] last month -On admission found to have elevated lactic acid and CXR demonstrating new right lower lobe infiltrates. -Vanc and cefepime started on admission, then changed to vanc/rocephin/zithro, dropped zithro due to frequent NSVT. Then changed to oral doxycycline on 5/7, repeat CT chest with persistent multifocal pna, restarted vanc/ cefepime. Stopped the vancomycin as her creatinine starting to go up and the trough was 28. - her cefepime was discontinued on 5/16 after she completed about 7 days of IV antibiotics.  - repeat CXR-improved  - her blood cultures have been negative so far.  -sepsis resolved, now stable off Abx   2-Alcoholic hepatitis/cirrhosis with ascites: -negative hepatitis panel, ascites cytology no malignant cells reported, CEA/afp negative, family reported patient took lots of tylenol for pain control after ankle surgery a few months ago, tylenol level low on admission, heavy ETOH use -s/p paracentesis, paritoneal fluids analysis:  wbc 72, gram stain no organism seen , hence No SBP -[facility]  was consulted:recommended to continue lactulose and rifaximin , and nothing else to add from their perspective.   - she was also treated with steroids at [facility] last month for Alcoholic hepatitis  3- Encephalopathy: confusion, hallucination, delirium, per chart review she has been having hallucination during last two hospitalization, confusion likely combination of alcohol withdrawal, wernicke's, infection, hepatic encephalopathy, Paranoia. -on lactulose, rifaximin, ammonia level improved -ct head negative, psychiatry consulted for capacity eval,, she was deemed non competant to make decisions, started on low dose Seroquel by Psych. -i called and d/w boyfriend about need for extra assistance at home or ALF at discharge -Family wants her to go to a skilled nursing facility, according to PT, SNF not recommended, she needs supervision, ALF not an options due to finances  4-Hypothyroidism: -TSH 5.144 -on synthroid  5-GERD (gastroesophageal reflux disease): -continue PPI  6-chronic pain syndrome: - decrease/minimize narcotics due to mentation -has not required much narcotics in the hospital  7. INR elevated: -due to coagulopathy from sepsis  8. Bilateral lower extremity edema: likely from cirrhosis, venous US no DVT -continue lasix and aldactone  9. Hypokalemia/hypomagnesemia: replace k/mag as needed.   10; frequent NSVT: echo adequate EF, no wall motion abnromalities, keep mag>2 and k>4, started betablocker. Not a candidate for amiodarone due to liver impairment. stopped zithromax.  Resolved, Cardiology consulted, recommedations given. .  11:Acute kidney injury : Due to Vanc toxicity, ? 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  13. Anemia: macrocytic. Stable around 10.   DVT prophylaxis:  scd's  Code Status: Full code Family Communication:  none at bedside.  Disposition Plan: unable to determine safe DC plan, will d/w CSW and CM,called and d/w fiance Scott, abt need to arrange sitter Consults called: [facility] for cirrhosis, Cardiology for NSVT, Psychiatry   Procedures:  [place] guided paracentesis on 5/4  Antibiotics:  Vanc from admission to 5/7, restarted from 5/8 till 5/13  cefepime from admission to 5/6 , restarted on 5/8 till 5/16  Rocephin/zithro from 5/6 to 5/7  Doxycycline from 5/7 to 5/8.   Objective: BP 100/50 mmHg  Pulse 93  Temp(Src) 98 F (36.7 C) (Oral)  Resp 18  Ht 5\' 8"  (1.727 m)  Wt 91.9 kg (202 lb 9.6 oz)  BMI 30.81 kg/m2  SpO2 100%  LMP 10/21/2014 (Approximate)  Intake/Output Summary (Last 24 hours) at 08/26/15 1631 Last data filed at 08/26/15 1530  Gross per 24 hour  Intake    370 ml  Output      0 ml  Net    370 ml   Filed Weights   08/23/15 0559 08/24/15 0500 08/26/15 0504  Weight: 91.5 kg (201 lb 11.5 oz) 92.9 kg (204 lb 12.9 oz) 91.9 kg (202 lb 9.6 oz)    Exam:   General:  Alert, awake, oriented to place and person, some delusional thoughts/words in between  Cardiovascular: RRR, no murmurs heard.   Respiratory: improved aeration, no wheezing  Abdomen: soft, non distended, NT, positive BS  Musculoskeletal: 2plus edema  Neuro: slight confusion, non focal exam  Data Reviewed: Basic Metabolic Panel:  Recent Labs Lab 08/21/15 1026 08/22/15 0500 08/23/15 0600 08/24/15 0435 08/25/15 0422 08/26/15 0502  NA  --  142 140 139 140 138  K  --  3.2* 3.7 3.3* 3.6 3.5  CL  --  114* 114* 109 111 111  CO2  --  21* 21* 21* 23 22  GLUCOSE  --  100* 106* 104* 95 95  BUN  --  21* 19 19 18 17   CREATININE  --  1.42* 1.44* 1.42* 1.41* 1.36*  CALCIUM  --  8.0* 7.9* 8.0* 7.9* 8.1*  MG 1.6*  --  1.8  --  1.6*  --    Liver Function Tests:  Recent Labs Lab 08/25/15 0422  AST 65*  ALT 25  ALKPHOS 83  BILITOT 5.0*  PROT 4.5*   ALBUMIN 2.0*   No results for input(s): LIPASE, AMYLASE in the last 168 hours.  Recent Labs Lab 08/24/15 1249  AMMONIA 54*   CBC:  Recent Labs Lab 08/20/15 0215 08/25/15 0422 08/26/15 0502  WBC 13.2* 7.4 9.0  HGB 10.5* 8.8* 9.7*  HCT 30.0* 25.7* 28.2*  MCV 106.8* 103.6* 102.9*  PLT 138* 90* 107*   Cardiac Enzymes:   No results for input(s): CKTOTAL, CKMB, CKMBINDEX, TROPONINI in the last 168 hours. BNP (last 3 results) No results for input(s): BNP in the last 8760 hours.  ProBNP (last 3 results) No results for input(s): PROBNP in the last 8760 hours.  CBG: No results for input(s): GLUCAP in the last 168 hours.  No results found for this or any previous visit (from the past 240 hour(s)).   Studies: No results found.  Scheduled Meds: . folic acid  1 mg Oral Daily  . furosemide  40 mg Oral Daily  . guaiFENesin  600 mg Oral BID  . lactulose  30 g Oral BID  . levothyroxine  50 mcg Oral QAC breakfast  . metoprolol tartrate  12.5 mg Oral BID  . pantoprazole  40 mg Oral  BID  . QUEtiapine  25 mg Oral BID  . rifaximin  550 mg Oral BID  . sodium chloride flush  10-40 mL Intracatheter Q12H  . spironolactone  25 mg Oral Daily  . thiamine  100 mg Oral Daily    Continuous Infusions:     Time spent: 35 mins  Jereme Loren MD  [facility] Pager [phone] If 7PM-7AM, please contact night-coverage at [email or web address], password [facility] 08/26/2015, 4:31 PM  LOS: 14 days

## 2015-08-26 NOTE — Progress Notes (Signed)
PICC dressing due to be changed today. Pt states is to be d/ced to home this evening or in am.  Requests not to change dressing until tomorrow.  Site WNL CDI.

## 2015-08-26 NOTE — Progress Notes (Signed)
Nutrition Brief Note  Patient identified for nutrition risk relating to LOS x 14 days.  Patient's weight fluctuating, weight is +28 lb since January. On Lasix. Pt with intermittent confusion. PO ranging from 25-50%. Will monitor for plan.  Wt Readings from Last 15 Encounters:  08/26/15 202 lb 9.6 oz (91.9 kg)  08/02/15 210 lb 11.2 oz (95.573 kg)  07/19/15 206 lb (93.441 kg)  04/27/15 174 lb (78.926 kg)    Body mass index is 30.81 kg/(m^2). Patient meets criteria for obesity based on current BMI.   Current diet order is 2GMNA, patient is consuming approximately 25-50% of meals at this time. Labs and medications reviewed.   No further nutrition interventions warranted at this time. If nutrition issues arise, please consult RD.   Tilda FrancoLindsey Ario Mcdiarmid, MS, RD, LDN Pager: [phone] After Hours Pager: [phone] [phone]

## 2015-08-26 NOTE — Consult Note (Signed)
[facility] Face-to-Face Psychiatry Consult Follow Up  Reason for Consult:  AMS and intermittent hallucinations and alcoholic cirrhosis and sepsis Referring Physician:  Dr. Karleen Hampshire Patient Identification: Rhonda Fuentes MRN:  [id number] Principal Diagnosis: Altered mental status Diagnosis:   Patient Active Problem List   Diagnosis Date Noted  . Confusion [R41.0]   . Altered mental status [R41.82]   . Encephalopathy, hepatic ([place]) [K72.90] 08/12/2015  . Alcoholic cirrhosis ([place]) [W10.27] 08/12/2015  . Lactic acidosis [E87.2] 08/12/2015  . Hypothyroidism [E03.9] 08/12/2015  . GERD (gastroesophageal reflux disease) [K21.9] 08/12/2015  . Sepsis ([place]) [A41.9] 08/12/2015  . HCAP (healthcare-associated pneumonia) [J18.9]   . Pneumonia [J18.9] 08/02/2015  . Alcoholic hepatitis with ascites [K70.11] 07/15/2015    Total Time spent with patient: 30 minutes  Subjective:   Rhonda Fuentes is a 50 y.o. female patient admitted with hallucinations, sepsis, alcohol cirrhosis and pneumonia.  HPI:  [name] is a 50 y.o. Female, seen Chart reviewed and case discussed with staff RN and Dr. Karleen Hampshire Patient appeared lying on her bed and talking on the phone with her sister and asking questions like name of this provider and the speciality. Patient did not get off the phone even after introduced myself and stated she wanted her sister to be involved in the treatment and try to keep on the speaker phone without knowing there is a phone option is not available. When I try to help her with the speaker phone option I found that her sister was not on the phone at all. Patient stated she does not trust people in the hospital, the only people she can trust is her sister and her mother who were not in the hospital. Patient is also feels paranoid about people talking about her in her absence. Patient stated she does not know why she was in the hospital and she could not recall the name of the problem she came to the hospital  etc.Patient seems to be highly confused, poor historian and easily getting frustrated because she was not able to answer simple questions. Patient endorses drinking vodka but could not gave me the details or the pattern of drinking and Her last drink. Patient has a tangential and circumstantial thought process and continued to be talking extensively but not able to provide any's specific details of her Physical health problems, mental health Issues, and substance abuseproblems.  Past Psychiatric History: History of alcohol dependence, and has no [facility] admissions as per chart review.   Interval history: Patient seen today for psychiatric consultation follow-up as requested by Dr. Broadus John and case manager as patient family is requesting to be placed in psychiatric hospital. Patient does not meet criteria for acute psychiatric hospitalization as she has no active suicidal/homicidal ideation, depression, anxiety, bipolar mania and positively responding to her psychiatric medication prescribed to this hospitalization. Patient is less confused today and more appropriate with her verbal responses. Patient has appropriate affect. Patient has a less talkative, no pressured speech and less tangentiality and circumstantial thought process. Patient has better orientation, concentration, memory functions and language intact but no abstract thinking. Patient has a fair insight and judgment.  Risk to Self: Is patient at risk for suicide?: No Risk to Others:   Prior Inpatient Therapy:   Prior Outpatient Therapy:    Past Medical History:  Past Medical History  Diagnosis Date  . Anemia   . Thyroid disease   . Alcohol abuse   . 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 Cholecystectomy    . Gastric bypass    . Ankle surgery     Family History:  Family History  Problem Relation Age of Onset  . Breast cancer Sister    Family Psychiatric  History: significant for  alcohol abuse vs dependence. Social History:  History  Alcohol Use  . 12.0 oz/week  . 20 Standard drinks or equivalent per week     History  Drug Use  . Yes  . Special: Marijuana    Social History   Social History  . Marital Status: Single    Spouse Name: N/A  . Number of Children: N/A  . Years of Education: N/A   Social History Main Topics  . Smoking status: Never Smoker   . Smokeless tobacco: Never Used  . Alcohol Use: 12.0 oz/week    20 Standard drinks or equivalent per week  . Drug Use: Yes    Special: Marijuana  . Sexual Activity: Not Currently   Other Topics Concern  . None   Social History Narrative   Additional Social History:    Allergies:  No Known Allergies  Labs:  Results for orders placed or performed during the hospital encounter of 08/12/15 (from the past 48 hour(s))  Ammonia     Status: Abnormal   Collection Time: 08/24/15 12:49 PM  Result Value Ref Range   Ammonia 54 (H) 9 - 35 umol/L  Protime-INR     Status: Abnormal   Collection Time: 08/25/15  4:22 AM  Result Value Ref Range   Prothrombin Time 23.3 (H) 11.6 - 15.2 seconds   INR 2.09 (H) 0.00 - 1.49  CBC     Status: Abnormal   Collection Time: 08/25/15  4:22 AM  Result Value Ref Range   WBC 7.4 4.0 - 10.5 K/uL   RBC 2.48 (L) 3.87 - 5.11 MIL/uL   Hemoglobin 8.8 (L) 12.0 - 15.0 g/dL   HCT 25.7 (L) 36.0 - 46.0 %   MCV 103.6 (H) 78.0 - 100.0 fL   MCH 35.5 (H) 26.0 - 34.0 pg   MCHC 34.2 30.0 - 36.0 g/dL   RDW 14.6 11.5 - 15.5 %   Platelets 90 (L) 150 - 400 K/uL    Comment: SPECIMEN CHECKED FOR CLOTS PLATELET COUNT CONFIRMED BY SMEAR   Comprehensive metabolic panel     Status: Abnormal   Collection Time: 08/25/15  4:22 AM  Result Value Ref Range   Sodium 140 135 - 145 mmol/L   Potassium 3.6 3.5 - 5.1 mmol/L   Chloride 111 101 - 111 mmol/L   CO2 23 22 - 32 mmol/L   Glucose, Bld 95 65 - 99 mg/dL   BUN 18 6 - 20 mg/dL   Creatinine, Ser 1.41 (H) 0.44 - 1.00 mg/dL   Calcium 7.9 (L)  8.9 - 10.3 mg/dL   Total Protein 4.5 (L) 6.5 - 8.1 g/dL   Albumin 2.0 (L) 3.5 - 5.0 g/dL   AST 65 (H) 15 - 41 U/L   ALT 25 14 - 54 U/L   Alkaline Phosphatase 83 38 - 126 U/L   Total Bilirubin 5.0 (H) 0.3 - 1.2 mg/dL   GFR calc non Af Amer 43 (L) >60 mL/min   GFR calc Af Amer 50 (L) >60 mL/min    Comment: (NOTE) The eGFR has been calculated using the CKD EPI equation. This calculation has not been validated in all clinical situations. eGFR's persistently <60 mL/min signify possible Chronic Kidney Disease.  Anion gap 6 5 - 15  Magnesium     Status: Abnormal   Collection Time: 08/25/15  4:22 AM  Result Value Ref Range   Magnesium 1.6 (L) 1.7 - 2.4 mg/dL  Protime-INR     Status: Abnormal   Collection Time: 08/26/15  5:02 AM  Result Value Ref Range   Prothrombin Time 23.3 (H) 11.6 - 15.2 seconds   INR 2.08 (H) 0.00 - 1.49  CBC     Status: Abnormal   Collection Time: 08/26/15  5:02 AM  Result Value Ref Range   WBC 9.0 4.0 - 10.5 K/uL   RBC 2.74 (L) 3.87 - 5.11 MIL/uL   Hemoglobin 9.7 (L) 12.0 - 15.0 g/dL   HCT 28.2 (L) 36.0 - 46.0 %   MCV 102.9 (H) 78.0 - 100.0 fL   MCH 35.4 (H) 26.0 - 34.0 pg   MCHC 34.4 30.0 - 36.0 g/dL   RDW 14.5 11.5 - 15.5 %   Platelets 107 (L) 150 - 400 K/uL    Comment: CONSISTENT WITH PREVIOUS RESULT  Basic metabolic panel     Status: Abnormal   Collection Time: 08/26/15  5:02 AM  Result Value Ref Range   Sodium 138 135 - 145 mmol/L   Potassium 3.5 3.5 - 5.1 mmol/L   Chloride 111 101 - 111 mmol/L   CO2 22 22 - 32 mmol/L   Glucose, Bld 95 65 - 99 mg/dL   BUN 17 6 - 20 mg/dL   Creatinine, Ser 1.36 (H) 0.44 - 1.00 mg/dL   Calcium 8.1 (L) 8.9 - 10.3 mg/dL   GFR calc non Af Amer 45 (L) >60 mL/min   GFR calc Af Amer 52 (L) >60 mL/min    Comment: (NOTE) The eGFR has been calculated using the CKD EPI equation. This calculation has not been validated in all clinical situations. eGFR's persistently <60 mL/min signify possible Chronic Kidney Disease.     Anion gap 5 5 - 15    Current Facility-Administered Medications  Medication Dose Route Frequency Provider Last Rate Last Dose  . folic acid (FOLVITE) tablet 1 mg  1 mg Oral Daily Barton Dubois, MD   1 mg at 08/26/15 1034  . furosemide (LASIX) tablet 40 mg  40 mg Oral Daily Hosie Poisson, MD   40 mg at 08/26/15 1034  . guaiFENesin (MUCINEX) 12 hr tablet 600 mg  600 mg Oral BID Florencia Reasons, MD   600 mg at 08/26/15 1034  . lactulose (CHRONULAC) 10 GM/15ML solution 30 g  30 g Oral BID Florencia Reasons, MD   30 g at 08/26/15 1034  . levothyroxine (SYNTHROID, LEVOTHROID) tablet 50 mcg  50 mcg Oral QAC breakfast Barton Dubois, MD   50 mcg at 08/26/15 0818  . metoprolol tartrate (LOPRESSOR) tablet 12.5 mg  12.5 mg Oral BID Arnoldo Lenis, MD   12.5 mg at 08/25/15 2107  . ondansetron (ZOFRAN) tablet 4 mg  4 mg Oral Q6H PRN Barton Dubois, MD       Or  . ondansetron [facility]) injection 4 mg  4 mg Intravenous Q6H PRN Barton Dubois, MD      . pantoprazole (PROTONIX) EC tablet 40 mg  40 mg Oral BID Barton Dubois, MD   40 mg at 08/26/15 1033  . QUEtiapine (SEROQUEL) tablet 25 mg  25 mg Oral BID Domenic Polite, MD   25 mg at 08/26/15 1034  . rifaximin (XIFAXAN) tablet 550 mg  550 mg Oral BID Florencia Reasons, MD  550 mg at 08/26/15 1033  . sodium chloride flush (NS) 0.9 % injection 10-40 mL  10-40 mL Intracatheter Q12H Florencia Reasons, MD   10 mL at 08/23/15 2228  . sodium chloride flush (NS) 0.9 % injection 10-40 mL  10-40 mL Intracatheter PRN Florencia Reasons, MD   20 mL at 08/25/15 0435  . sodium chloride flush (NS) 0.9 % injection 10-40 mL  10-40 mL Intracatheter PRN Hosie Poisson, MD      . spironolactone (ALDACTONE) tablet 25 mg  25 mg Oral Daily Arnoldo Lenis, MD   25 mg at 08/26/15 1033  . thiamine (VITAMIN B-1) tablet 100 mg  100 mg Oral Daily Barton Dubois, MD   100 mg at 08/26/15 1033  . zolpidem (AMBIEN) tablet 5 mg  5 mg Oral QHS PRN Dianne Dun, NP   5 mg at 08/19/15 2111    Musculoskeletal: Strength & Muscle  Tone: decreased Gait & Station: unable to stand Patient leans: N/A  Psychiatric Specialty Exam: Review of Systems  Unable to perform ROS  Altered mental status, swollen legs and poor historian  Blood pressure 84/52, pulse 93, temperature 98.1 F (36.7 C), temperature source Oral, resp. rate 18, height 5' 8" (1.727 m), weight 91.9 kg (202 lb 9.6 oz), last menstrual period 10/21/2014, SpO2 100 %.Body mass index is 30.81 kg/(m^2).  General Appearance: Bizarre and Guarded  Eye Contact::  Fair  Speech:  Blocked and Clear and Coherent  Volume:  Normal  Mood:  Anxious  Affect:  Inappropriate and Labile  Thought Process:  Irrelevant and Tangential  Orientation:  Negative  Thought Content:  Hallucinations: Visual, Paranoid Ideation and Rumination  Suicidal Thoughts:  No  Homicidal Thoughts:  No  Memory:  Immediate;   Fair Recent;   Poor  Judgement:  Impaired  Insight:  Shallow  Psychomotor Activity:  Decreased  Concentration:  Fair  Recall:  Poor  Fund of Knowledge:Fair  Language: Fair  Akathisia:  No  Handed:  Right  AIMS (if indicated):     Assets:  Others:  deferred  ADL's:  Impaired  Cognition: Impaired,  Severe  Sleep:      Treatment Plan Summary: Patient is less confused with her current medication management during this visit  Patient does not meet criteria for acute psychiatric hospitalization as she has no suicidal or homicidal ideation and her psychosis has been possibly responding with the current medication management.  Patient does meet criteria for capacity to make her own medical decisions based on evaluation. Continue Seroquel 25 mg 3 times daily for increased frustration, agitation and hallucinations  Appreciate psychiatric consultation and we sign off as of today Please contact [phone] or [phone] if needs further assistance   Disposition: Patient benefit from out-of-home placement when medically stable. Patient does not meet criteria for psychiatric  inpatient admission. Supportive therapy provided about ongoing stressors.  Durward Parcel., MD 08/26/2015 11:04 AM

## 2015-08-27 ENCOUNTER — Other Ambulatory Visit: Payer: Self-pay | Admitting: Internal Medicine

## 2015-08-27 DIAGNOSIS — K7011 Alcoholic hepatitis with ascites: Secondary | ICD-10-CM

## 2015-08-27 LAB — COMPREHENSIVE METABOLIC PANEL
ALBUMIN: 2 g/dL — AB (ref 3.5–5.0)
ALT: 24 U/L (ref 14–54)
ANION GAP: 5 (ref 5–15)
AST: 72 U/L — ABNORMAL HIGH (ref 15–41)
Alkaline Phosphatase: 93 U/L (ref 38–126)
BILIRUBIN TOTAL: 4.5 mg/dL — AB (ref 0.3–1.2)
BUN: 17 mg/dL (ref 6–20)
CO2: 24 mmol/L (ref 22–32)
Calcium: 7.9 mg/dL — ABNORMAL LOW (ref 8.9–10.3)
Chloride: 111 mmol/L (ref 101–111)
Creatinine, Ser: 1.29 mg/dL — ABNORMAL HIGH (ref 0.44–1.00)
GFR, EST AFRICAN AMERICAN: 55 mL/min — AB (ref >60)
GFR, EST NON AFRICAN AMERICAN: 48 mL/min — AB (ref >60)
Glucose, Bld: 96 mg/dL (ref 65–99)
POTASSIUM: 3.2 mmol/L — AB (ref 3.5–5.1)
Sodium: 140 mmol/L (ref 135–145)
TOTAL PROTEIN: 4.7 g/dL — AB (ref 6.5–8.1)

## 2015-08-27 LAB — CBC
HEMATOCRIT: 25 % — AB (ref 36.0–46.0)
HEMOGLOBIN: 8.7 g/dL — AB (ref 12.0–15.0)
MCH: 35.5 pg — ABNORMAL HIGH (ref 26.0–34.0)
MCHC: 34.8 g/dL (ref 30.0–36.0)
MCV: 102 fL — ABNORMAL HIGH (ref 78.0–100.0)
Platelets: 101 10*3/uL — ABNORMAL LOW (ref 150–400)
RBC: 2.45 MIL/uL — AB (ref 3.87–5.11)
RDW: 14.7 % (ref 11.5–15.5)
WBC: 7.6 10*3/uL (ref 4.0–10.5)

## 2015-08-27 LAB — PROTIME-INR
INR: 2.11 — ABNORMAL HIGH (ref 0.00–1.49)
Prothrombin Time: 22.8 seconds — ABNORMAL HIGH (ref 11.6–15.2)

## 2015-08-27 MED ORDER — LACTULOSE 10 GM/15ML PO SOLN: 20.0000 g | Freq: Two times a day (BID) | ORAL | Status: AC

## 2015-08-27 MED ORDER — QUETIAPINE FUMARATE 25 MG PO TABS
25.0000 mg | ORAL_TABLET | Freq: Two times a day (BID) | ORAL | Status: AC
Start: 2015-08-27 — End: ?

## 2015-08-27 MED ORDER — FUROSEMIDE 40 MG PO TABS: 40.0000 mg | ORAL_TABLET | Freq: Every day | ORAL | Status: AC

## 2015-08-27 MED ORDER — SPIRONOLACTONE 25 MG PO TABS: 50.0000 mg | ORAL_TABLET | Freq: Every day | ORAL | Status: AC

## 2015-08-27 MED ORDER — SPIRONOLACTONE 25 MG PO TABS
50.0000 mg | ORAL_TABLET | Freq: Every day | ORAL | Status: DC
  Administered 2015-08-27: 50 mg via ORAL

## 2015-08-27 MED ORDER — METOPROLOL TARTRATE 25 MG PO TABS
12.5000 mg | ORAL_TABLET | Freq: Two times a day (BID) | ORAL | Status: AC
Start: 2015-08-27 — End: ?

## 2015-08-27 NOTE — Discharge Summary (Signed)
Physician Discharge Summary  Rhonda Fuentes ZOX:[id number] DOB: February 28, 1966 DOA: 08/12/2015  PCP: No PCP Per Patient  Admit date: 08/12/2015 Discharge date: 08/27/2015  Time spent: 45 minutes  Recommendations for Outpatient Follow-up:  1. New PCP as suggested by Case manager, please titrate up lasix dose as BP tolerates for lower extremity edema 2. Still has residual hallucinations, needs FU with Psych at [facility], Long term- wean off Seroquel, as hallucinations/delusions improve  Discharge Diagnoses:  Principal Problem:   Altered mental status   Hepatic encephalopathy   Halluscinations   Alcoholic hepatitis/Cirrhosis with ascites   Edema   Hypoalbuminemia   Pneumonia   Encephalopathy, hepatic (HCC)   Alcoholic cirrhosis (HCC)   Lactic acidosis   Hypothyroidism   GERD (gastroesophageal reflux disease)   Sepsis (HCC)   HCAP (healthcare-associated pneumonia)   Confusion   Discharge Condition:stable  Diet recommendation: low sodium  Filed Weights   08/26/15 0504 08/26/15 1812 08/27/15 0500  Weight: 91.9 kg (202 lb 9.6 oz) 90.311 kg (199 lb 1.6 oz) 88.4 kg (194 lb 14.2 oz)    History of present illness:  Rhonda Fuentes is a 50 y.o. female with PMH significant for alcoholic cirrhosis, hypothyroidism, chronic pain syndrome and recent admission due to PNA and encephalopathy (admitted to [facility]). Patient presented from GI office due to ongoing jaundice, confusion, low BP and difficulty breathing. In ED found to have abnormal CXR (with new infiltrates component) and features of sepsis. She was admitted for evaluation and management of health care associated pneumonia and hepatic encephalopathy.   Hospital Course:  1-Sepsis [facility] [address]): due to HCAP  -recently admitted for PNA and hepatic encephalopathy at [facility] last month -On admission here found to have elevated lactic acid and CXR revealed new right lower lobe infiltrates. -Vanc and cefepime started on admission, then changed  to vanc/rocephin/zithro, dropped zithro due to frequent NSVT. Then changed to oral doxycycline on 5/7, repeat CT chest with persistent multifocal pna, restarted vanc/ cefepime, then stopped vancomycin due to Acute kidney injury. - her cefepime was discontinued on 5/16 after she completed about 7 days of IV antibiotics.  - repeat CXR-improved  - her blood cultures have been negative so far.  -sepsis resolved, now stable off Abx  2-Alcoholic hepatitis/cirrhosis with ascites: -negative hepatitis panel, ascites cytology no malignant cells reported, CEA/afp negative, family reported patient took lots of tylenol for pain control after ankle surgery a few months ago, tylenol level low on admission, h/o heavy ETOH use -s/p paracentesis, peritoneal fluids analysis: wbc 72, gram stain no organism seen ,  No SBP - [facility] was consulted:recommended to continue lactulose and rifaximin , and nothing else to add from their perspective.  - she was also treated with steroids at [facility] last month for Alcoholic hepatitis  3- Encephalopathy: confusion, hallucination, delirium, per chart review she has been having hallucination during last two hospitalization, confusion likely combination of alcohol withdrawal, wernicke's, infection, hepatic encephalopathy, Paranoia. -on lactulose, rifaximin, ammonia level improved -ct head negative, psychiatry consulted for capacity eval,, she was deemed non competant to make decisions, started on low dose Seroquel by Psych. -i called and d/w boyfriend about need for extra assistance at home or ALF at discharge -Family wants her to go to a skilled nursing facility, according to PT, SNF not recommended, she needs supervision, ALF not an options due to finances, home health RN set up, and boyfriend arranged for a private sitter for supervision during the daytime  4. Bilateral lower extremity edema: due to  cirrhosis/hypoalbumineemia -venous US no DVT -continue lasix and  aldactone -titrate up lasix dose as BP tolerates, BP at baseline in 90s-100 range hence unable to titrate this much in last week  5-Hypothyroidism: -TSH 5.144 -on synthroid  6-GERD (gastroesophageal reflux disease): -continue PPI  7-chronic pain syndrome: - decrease/minimize narcotics due to mentation -has not required much narcotics at all in the hospital, hence we stopped this  8. INR elevated/Coaguloapthy -due to coagulopathy liver disease  9. Hypokalemia/hypomagnesemia: replace k/mag as needed.   10; frequent NSVT: echo adequate EF, no wall motion abnromalities, keep mag>2 and k>4, started betablocker. Not a candidate for amiodarone due to liver impairment. stopped zithromax.  Resolved, Cardiology consulted, recommedations given. .  11:Acute kidney injury : Due to Vanc toxicity, ? 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  13. Anemia: macrocytic. Stable around 10.   Discharge Exam: Filed Vitals:   08/26/15 2152 08/27/15 1226  BP: 127/65 103/60  Pulse: 96   Temp: 98.1 F (36.7 C)   Resp: 18     General: AAOx3 Cardiovascular: S1S2/RRR Respiratory:CTAB  Discharge Instructions   Discharge Instructions    Diet - low sodium heart healthy    Complete by:  As directed      Increase activity slowly    Complete by:  As directed           Current Discharge Medication List    START taking these medications   Details  metoprolol tartrate (LOPRESSOR) 25 MG tablet Take 0.5 tablets (12.5 mg total) by mouth 2 (two) times daily. Qty: 30 tablet, Refills: 0    QUEtiapine (SEROQUEL) 25 MG tablet Take 1 tablet (25 mg total) by mouth 2 (two) times daily. Qty: 60 tablet, Refills: 0    rifaximin (XIFAXAN) 550 MG TABS tablet Take 1 tablet (550 mg total) by mouth 2 (two) times daily. Qty:  42 tablet, Refills: 0      CONTINUE these medications which have CHANGED   Details  furosemide (LASIX) 40 MG tablet Take 1 tablet (40 mg total) by mouth daily. Qty: 30 tablet, Refills: 0    spironolactone (ALDACTONE) 25 MG tablet Take 2 tablets (50 mg total) by mouth daily. Qty: 60 tablet, Refills: 1      CONTINUE these medications which have NOT CHANGED   Details  fluticasone (FLONASE) 50 MCG/ACT nasal spray Place 2 sprays into both nostrils daily as needed for rhinitis.     folic acid (FOLVITE) 1 MG tablet Take 1 tablet (1 mg total) by mouth daily. Qty: 30 tablet, Refills: 2    lactulose (CHRONULAC) 10 GM/15ML solution Take 30 mLs (20 g total) by mouth 2 (two) times daily. Qty: 240 mL, Refills: 0    levothyroxine (SYNTHROID, LEVOTHROID) 50 MCG tablet Take 50 mcg by mouth daily before breakfast.  Refills: 2    pantoprazole (PROTONIX) 40 MG tablet Take 40 mg by mouth 2 (two) times daily.    thiamine (VITAMIN B-1) 100 MG tablet Take 100 mg by mouth daily.      STOP taking these medications     cyclobenzaprine (FLEXERIL) 10 MG tablet      Eszopiclone 3 MG TABS      gabapentin (NEURONTIN) 400 MG capsule      oxyCODONE (OXY IR/ROXICODONE) 5 MG immediate release tablet        No  Known Allergies Follow-up Information    Follow up with Please use the blue cross resources provided to you to assist with finding a pcp for follow up care .   Why:  Please verify any provider recommended to you is in network   Contact information:   Please go to [email or web address]/, locate find a doctor area in top right corner of page to use to find in network primary care provider and specialists or call the toll free number on the back of your insurance card to speak with a customer service staff       Follow up with Psychiatry.   Why:  [facility], [address] [place], [place], [place] [phone]. Call for an appointment.         The results of significant diagnostics  from this hospitalization (including imaging, microbiology, ancillary and laboratory) are listed below for reference.    Significant Diagnostic Studies: Dg Chest 2 View  08/24/2015  CLINICAL DATA:  Follow-up pneumonia. EXAM: CHEST  2 VIEW COMPARISON:  08/16/2015 chest CT. 08/15/2015 and prior chest radiographs. FINDINGS: The cardiomediastinal silhouette is unremarkable. Bilateral airspace opacities have significantly decreased. Peribronchial thickening in subsegmental atelectasis/scarring within the mid lungs again noted. A right PICC line is again identified with tip overlying the superior cavoatrial junction. No new airspace disease, pleural effusion or pneumothorax noted. IMPRESSION: Significantly improved bilateral airspace opacities/pneumonia. Electronically Signed   By: Harmon Pier M.D.   On: 08/24/2015 15:02   Dg Chest 2 View  08/15/2015  CLINICAL DATA:  50 year old female with a history of pneumonia EXAM: CHEST - 2 VIEW COMPARISON:  08/12/2015 FINDINGS: Cardiomediastinal silhouette projects within normal limits in size and contour. Lung volumes remain low. Airspace opacity of the right upper lobe, abutting the minor fissure. Similar appearance of perihilar opacities and left sided linear opacities. No pleural effusion. No pneumothorax. Interval placement of right upper extremity PICC, with the tip appearing to terminate at the superior cavoatrial junction. No displaced fracture. Unremarkable appearance of the upper abdomen. IMPRESSION: Similar appearance of bilateral opacities, involving predominantly the right upper lobe, compatible with pneumonia. Interval placement of right upper extremity PICC Signed, Yvone Neu. [address]ta Ave, DO [facility] Electronically Signed   By: Gilmer Mor D.O.   On: 08/15/2015 19:03   Dg Chest 2 View  08/12/2015  CLINICAL DATA:  Patient states she has been on and off with SOB and coughing for months. Hx of childhood  asthma, pneumonia, and liver disease. EXAM: CHEST  2 VIEW COMPARISON:  08/02/2015 FINDINGS: Right upper lobe pneumonia has improved, with less extensive upper lobe consolidation that was present on the prior exam. Linear opacity in the left mid lung is stable which may reflect additional infection or atelectasis. Small areas of airspace opacity projects in the right lower lobe, not evident on the prior study. This may reflect new areas of infection or atelectasis. No pulmonary edema.  No pneumothorax or pleural effusion. Cardiac silhouette is normal in size and configuration. No mediastinal or hilar masses. IMPRESSION: 1. Interval improvement in the right upper lobe pneumonia although significant airspace opacification persists in the central right upper lobe. 2. Small patchy areas of airspace opacity in the right lower lobe are new, which may reflect new areas of pneumonia or be due to atelectasis. 3. Stable linear opacity in the left mid lung, which is likely atelectasis. Electronically Signed   By: Amie Portland M.D.   On: 08/12/2015 14:41   Ct Head  Wo Contrast  08/17/2015  CLINICAL DATA:  50 year old female with confusion EXAM: CT HEAD WITHOUT CONTRAST TECHNIQUE: Contiguous axial images were obtained from the base of the skull through the vertex without intravenous contrast. COMPARISON:  None. FINDINGS: The ventricles and the sulci are appropriate in size for the patient's age. There is no intracranial hemorrhage. No midline shift or mass effect identified. The gray-white matter differentiation is preserved. The visualized paranasal sinuses and mastoid air cells are well aerated. The calvarium is intact. IMPRESSION: No acute intracranial pathology. Electronically Signed   By: Elgie Collard M.D.   On: 08/17/2015 23:55   Ct Chest Wo Contrast  08/16/2015  CLINICAL DATA:  Cirrhosis, pneumonia, sepsis, alcoholic hepatitis EXAM: CT CHEST WITHOUT CONTRAST TECHNIQUE: Multidetector CT imaging of the chest was  performed following the standard protocol without IV contrast. COMPARISON:  Chest x-ray 08/15/2015 and images of the lung bases CT scan 07/16/2015 FINDINGS: Mediastinum/Lymph Nodes: Central airways are patent. There is right arm PICC line with tip in SVC right atrium junction. No mediastinal adenopathy. Heart size within normal limits. Trace posterior pericardial effusion. Lungs/Pleura: There is trace left pleural effusion. Atelectasis or infiltrate is noted in left lower lobe posteriorly. There is patchy infiltrate with air bronchogram in right upper lobe. Patchy infiltrate is noted in left upper lobe. Findings are highly suspicious for multifocal pneumonia. Small amount of atelectasis and trace fluid is noted along the right minor fissure. Upper abdomen: There is perihepatic ascites. Micro nodular liver contour suspicious for cirrhosis. Borderline splenomegaly. The patient is status post gastric bypass surgery. No adrenal gland mass is noted in visualized upper abdomen. Musculoskeletal: No destructive bony lesions are noted. Mild degenerative changes thoracic spine. No destructive rib lesions are noted. There is no axillary adenopathy. IMPRESSION: 1. There is infiltrate with air bronchogram in right upper lobe. Patchy infiltrate is noted in left upper lobe. Small left pleural effusion with left lower lobe posterior atelectasis or infiltrate. Findings highly suspicious for multifocal pneumonia. 2. No mediastinal adenopathy. 3. Right arm PICC line with tip in SVC right atrium junction. 4. There is perihepatic ascites. Micro nodular contour of the liver highly suspicious for cirrhosis. Borderline splenomegaly. 5. Status post gastric bypass surgery. 6. Mild degenerative changes thoracic spine. Electronically Signed   By: Natasha Mead M.D.   On: 08/16/2015 10:13   US Paracentesis  08/13/2015  INDICATION: Sepsis, alcoholic hepatitis/ cirrhosis, ascites; request made for diagnostic and therapeutic paracentesis. EXAM:  ULTRASOUND GUIDED DIAGNOSTIC AND THERAPEUTIC PARACENTESIS MEDICATIONS: None. COMPLICATIONS: None immediate. PROCEDURE: Informed written consent was obtained from the patient after a discussion of the risks, benefits and alternatives to treatment. A timeout was performed prior to the initiation of the procedure. Initial ultrasound scanning demonstrates a small amount of ascites within the left lower abdominal quadrant. The left lower abdomen was prepped and draped in the usual sterile fashion. 1% lidocaine was used for local anesthesia. Following this, a Yueh catheter was introduced. An ultrasound image was saved for documentation purposes. The paracentesis was performed. The catheter was removed and a dressing was applied. The patient tolerated the procedure well without immediate post procedural complication. FINDINGS: A total of approximately 1.1 liters of clear, golden yellow fluid was removed. Samples were sent to the laboratory as requested by the clinical team. IMPRESSION: Successful ultrasound-guided diagnostic and therapeutic paracentesis yielding 1.1 liters of peritoneal fluid. Read by: Jeananne Rama, PA-C Electronically Signed   By: Judie Petit.  Shick M.D.   On: 08/13/2015 10:19   Dg Chest [facility]  1 View  08/12/2015  CLINICAL DATA:  Ascites.  Nonsmoker. EXAM: PORTABLE CHEST 1 VIEW COMPARISON:  08/12/2015 at 1425 hours. FINDINGS: There are low lung volumes. Consolidation in the right upper lobe is similar to prior study. There is linear atelectasis in the left mid lung and probable mild atelectasis in the medial lung bases, also stable. No convincing new lung abnormalities. No pleural effusion.  No pneumothorax. Cardiac silhouette is normal in size. No mediastinal or hilar masses or convincing adenopathy. IMPRESSION: 1. No change from the earlier exam. 2. Right upper lobe pneumonia. 3. Left mid lung discoid atelectasis.  Mild basilar atelectasis. Electronically Signed   By: Amie Portland M.D.   On: 08/12/2015 18:42    Dg Chest Portable 1 View  08/02/2015  CLINICAL DATA:  Cough for 1 week.  Jaundice.  Hypotension. EXAM: PORTABLE CHEST 1 VIEW COMPARISON:  Abdominal pelvic CT 07/16/2015. No previous chest radiographs. FINDINGS: 1830 hours. There is persistent right hemidiaphragm elevation. The patchy atelectasis seen at both lung bases on the recent abdominal CT appears improved. However, there is more confluent right upper lobe opacity suspicious for pneumonia. There is no pleural effusion or pneumothorax. The heart size and mediastinal contours are normal. No acute osseous findings are seen. IMPRESSION: Right upper lobe airspace disease suspicious for pneumonia. Patchy bibasilar atelectasis appears improved from abdominal CT of 07/16/2015. Followup PA and lateral chest X-ray is recommended in 3-4 weeks following trial of antibiotic therapy to ensure resolution and exclude underlying malignancy. Electronically Signed   By: Carey Bullocks M.D.   On: 08/02/2015 18:53    Microbiology: No results found for this or any previous visit (from the past 240 hour(s)).   Labs: Basic Metabolic Panel:  Recent Labs Lab 08/21/15 1026  08/23/15 0600 08/24/15 0435 08/25/15 0422 08/26/15 0502 08/27/15 0520  NA  --   < > 140 139 140 138 140  K  --   < > 3.7 3.3* 3.6 3.5 3.2*  CL  --   < > 114* 109 111 111 111  CO2  --   < > 21* 21* GLUCOSE  --   < > 106* 104* 95 95 96  BUN  --   < > CREATININE  --   < > 1.44* 1.42* 1.41* 1.36* 1.29*  CALCIUM  --   < > 7.9* 8.0* 7.9* 8.1* 7.9*  MG 1.6*  --  1.8  --  1.6*  --   --   < > = values in this interval not displayed. Liver Function Tests:  Recent Labs Lab 08/25/15 0422 08/27/15 0520  AST 65* 72*  ALT 25 24  ALKPHOS 83 93  BILITOT 5.0* 4.5*  PROT 4.5* 4.7*  ALBUMIN 2.0* 2.0*   No results for input(s): LIPASE, AMYLASE in the last 168 hours.  Recent Labs Lab 08/24/15 1249  AMMONIA 54*   CBC:  Recent Labs Lab 08/25/15 0422  08/26/15 0502 08/27/15 0520  WBC 7.4 9.0 7.6  HGB 8.8* 9.7* 8.7*  HCT 25.7* 28.2* 25.0*  MCV 103.6* 102.9* 102.0*  PLT 90* 107* 101*   Cardiac Enzymes: No results for input(s): CKTOTAL, CKMB, CKMBINDEX, TROPONINI in the last 168 hours. BNP: BNP (last 3 results) No results for input(s): BNP in the last 8760 hours.  ProBNP (last 3 results) No results for input(s): PROBNP in the last 8760 hours.  CBG: No results for input(s): GLUCAP in the last 168 hours.  SignedZannie Cove MD.  [facility] 08/27/2015, 12:59 PM

## 2015-10-29 ENCOUNTER — Other Ambulatory Visit (HOSPITAL_COMMUNITY)
Admission: RE | Admit: 2015-10-29 | Discharge: 2015-10-29 | Disposition: A | Discharge status: Home / Self Care | Payer: BLUE CROSS/BLUE SHIELD | Source: Ambulatory Visit | Attending: Obstetrics and Gynecology | Admitting: Obstetrics and Gynecology

## 2015-10-29 ENCOUNTER — Other Ambulatory Visit: Payer: Self-pay | Admitting: Obstetrics and Gynecology

## 2015-10-29 DIAGNOSIS — Z01419 Encounter for gynecological examination (general) (routine) without abnormal findings: Secondary | ICD-10-CM | POA: Insufficient documentation

## 2015-10-29 DIAGNOSIS — R87810 Cervical high risk human papillomavirus (HPV) DNA test positive: Secondary | ICD-10-CM | POA: Insufficient documentation

## 2015-10-29 DIAGNOSIS — Z1151 Encounter for screening for human papillomavirus (HPV): Secondary | ICD-10-CM | POA: Diagnosis present

## 2015-11-02 LAB — CYTOLOGY - PAP

## 2017-05-12 IMAGING — DX DG CHEST 1V PORT
1 series · 1 of 1 positions shown · non-contrast
Comparison: 08/12/2015 at 3963 hours.

CLINICAL DATA: Ascites.  Nonsmoker.

EXAM:
PORTABLE CHEST 1 VIEW

[chest ap]
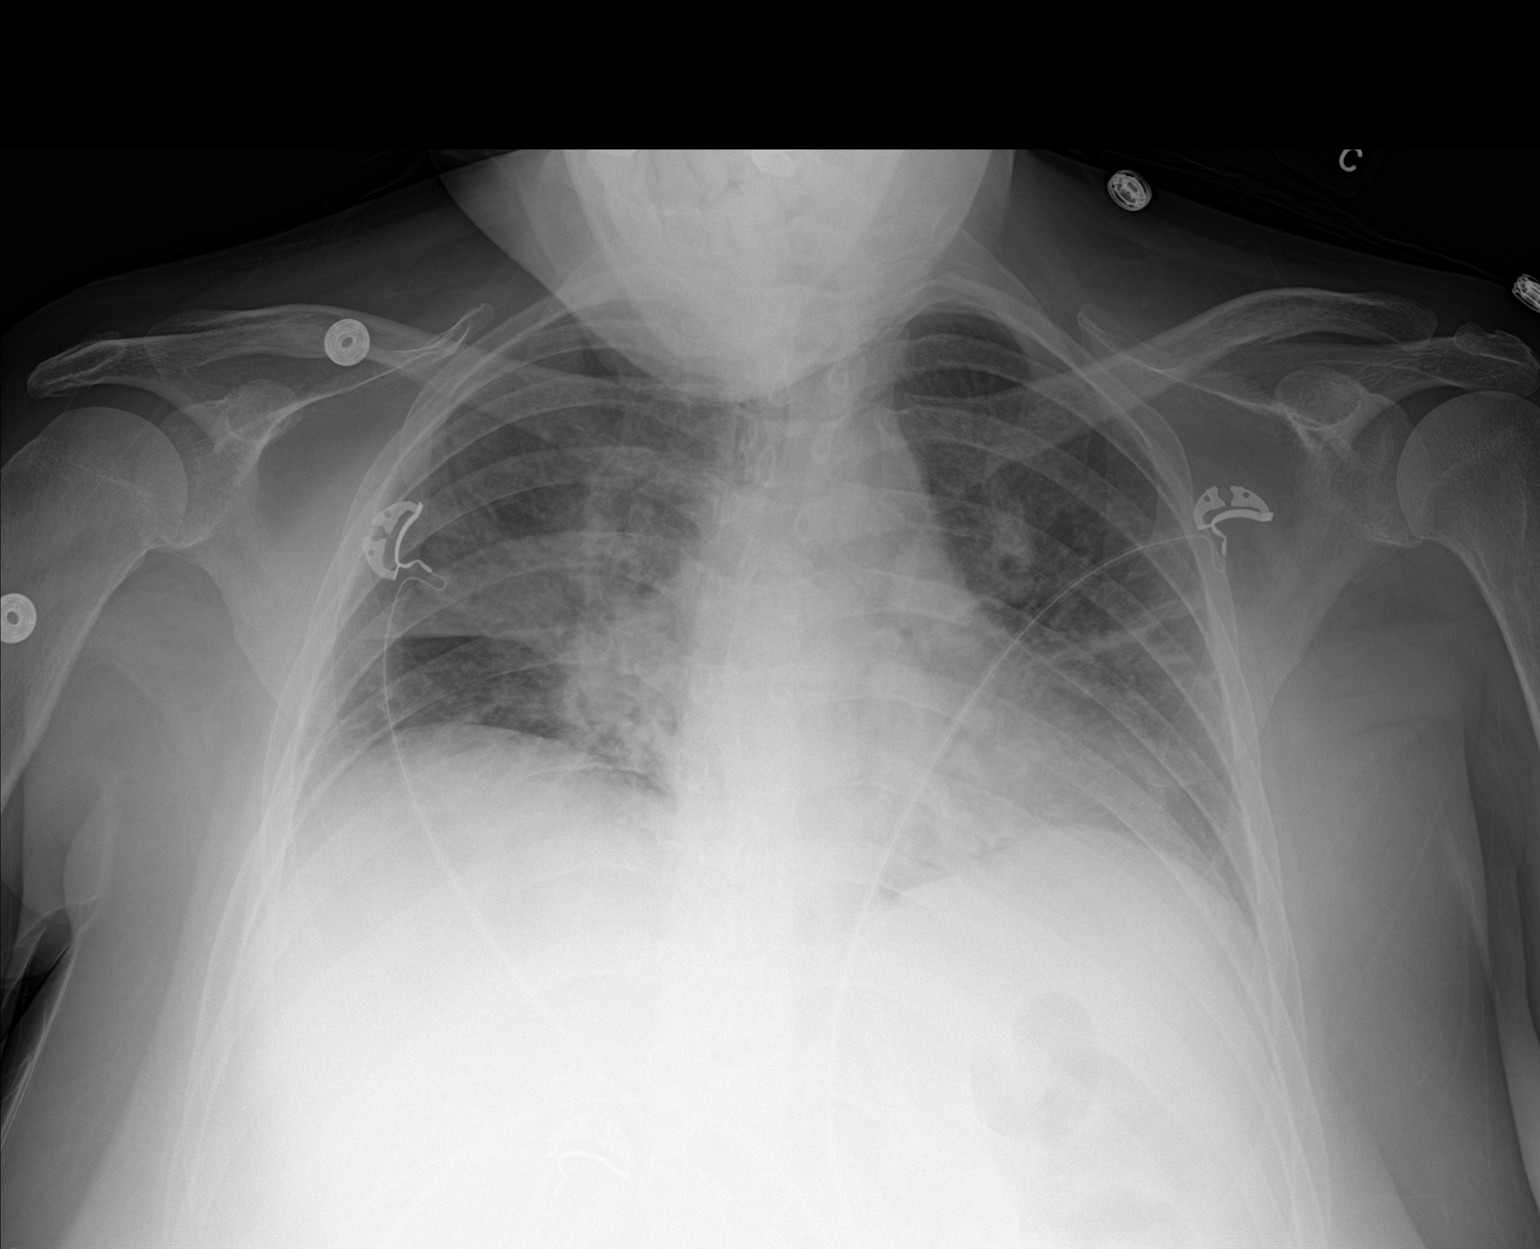

[1 of 1 positions shown; findings below may reference images not displayed]

FINDINGS: There are low lung volumes. Consolidation in the right upper lobe is
similar to prior study. There is linear atelectasis in the left mid
lung and probable mild atelectasis in the medial lung bases, also
stable. No convincing new lung abnormalities.

No pleural effusion.  No pneumothorax.

Cardiac silhouette is normal in size. No mediastinal or hilar masses
or convincing adenopathy.
IMPRESSION: 1. No change from the earlier exam.
2. Right upper lobe pneumonia.
3. Left mid lung discoid atelectasis.  Mild basilar atelectasis.

## 2017-05-13 IMAGING — US US PARACENTESIS
1 series · 6 of 6 positions shown · non-contrast
Comparison: none

INDICATION: Sepsis, alcoholic hepatitis/ cirrhosis, ascites; request made for
diagnostic and therapeutic paracentesis.

[Series 1: us paracentesis · 0.26mm/px · 6 of 6 slices shown]
[im 1/6]
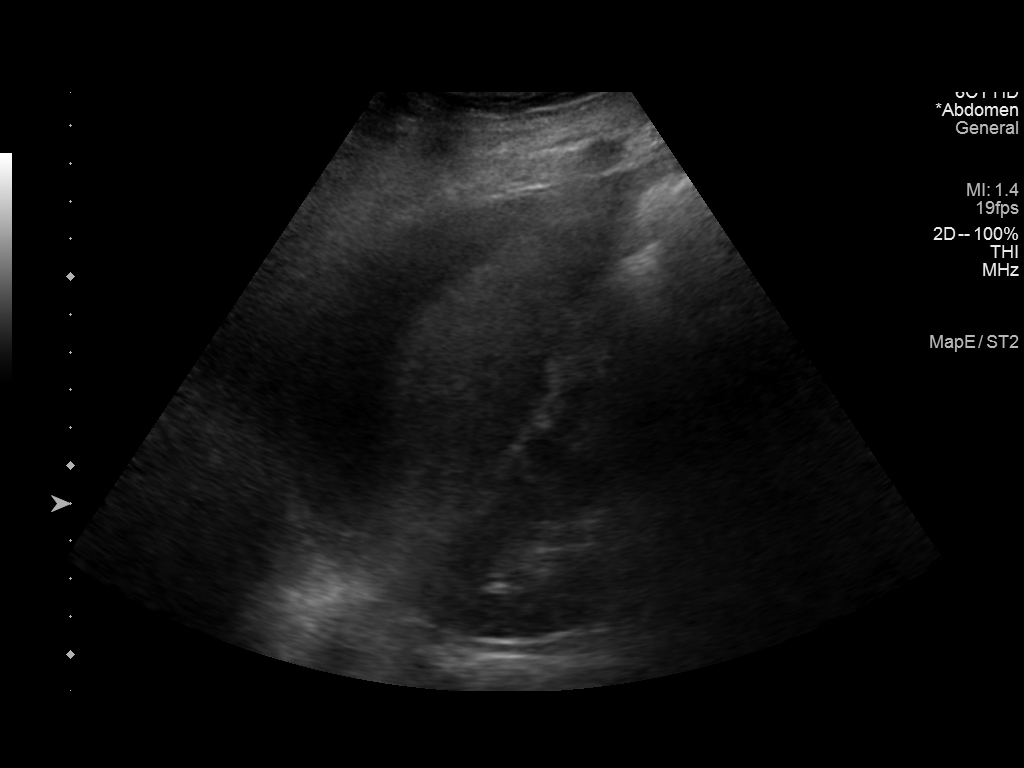
[im 2/6]
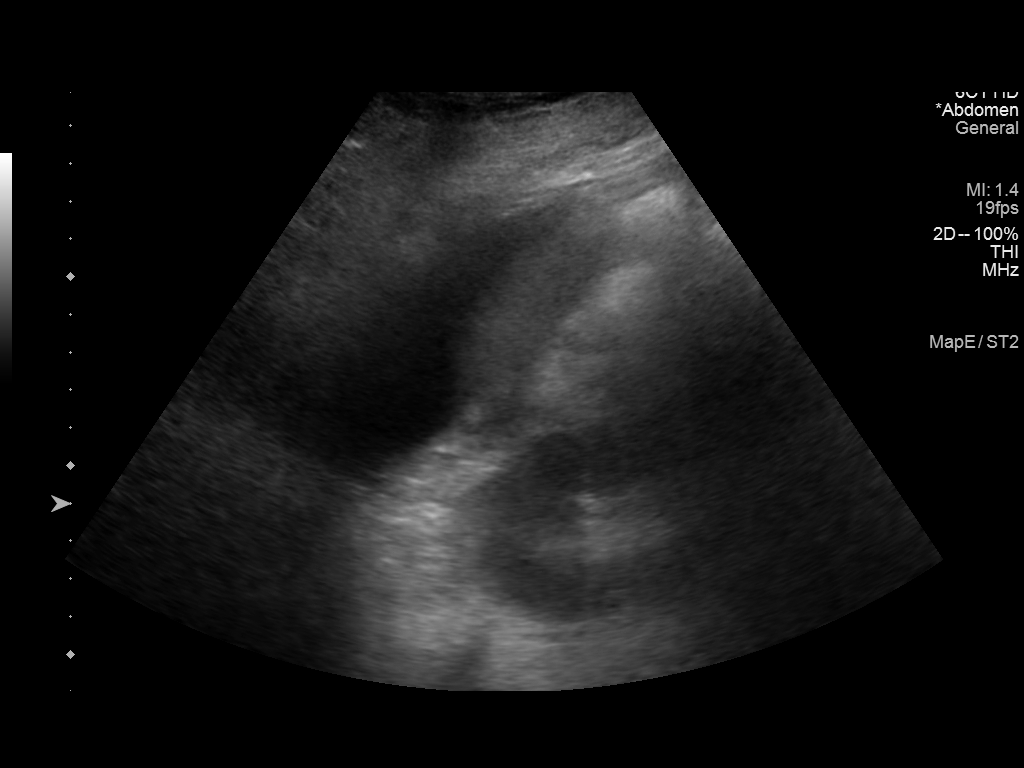
[im 3/6]
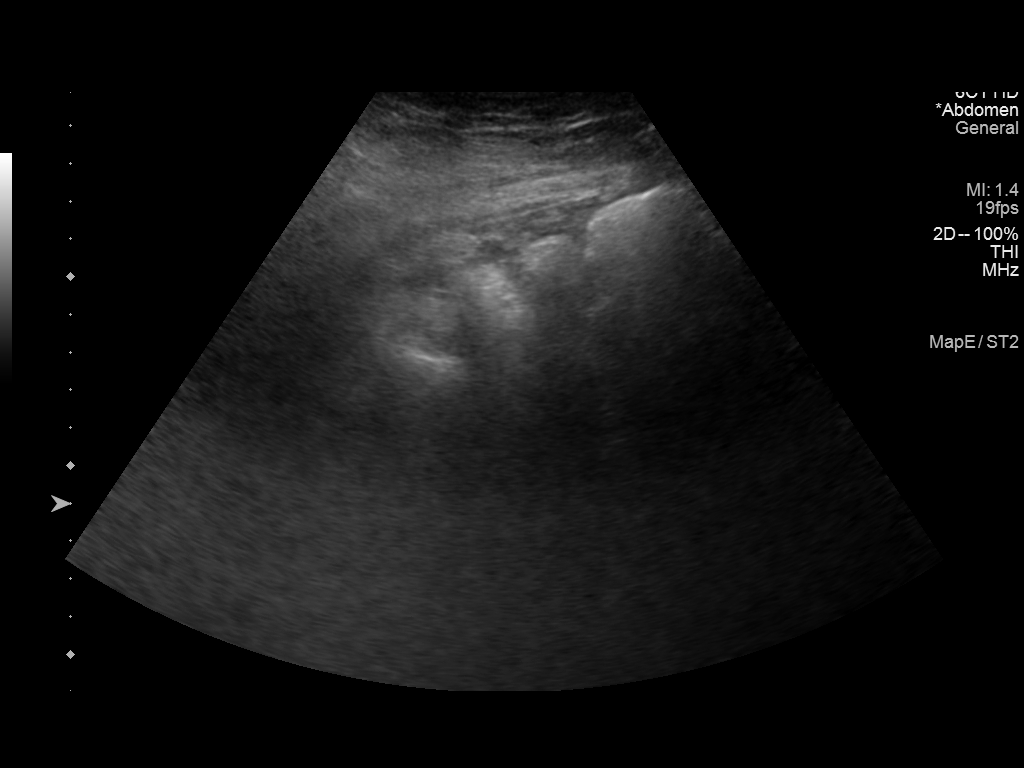
[im 4/6]
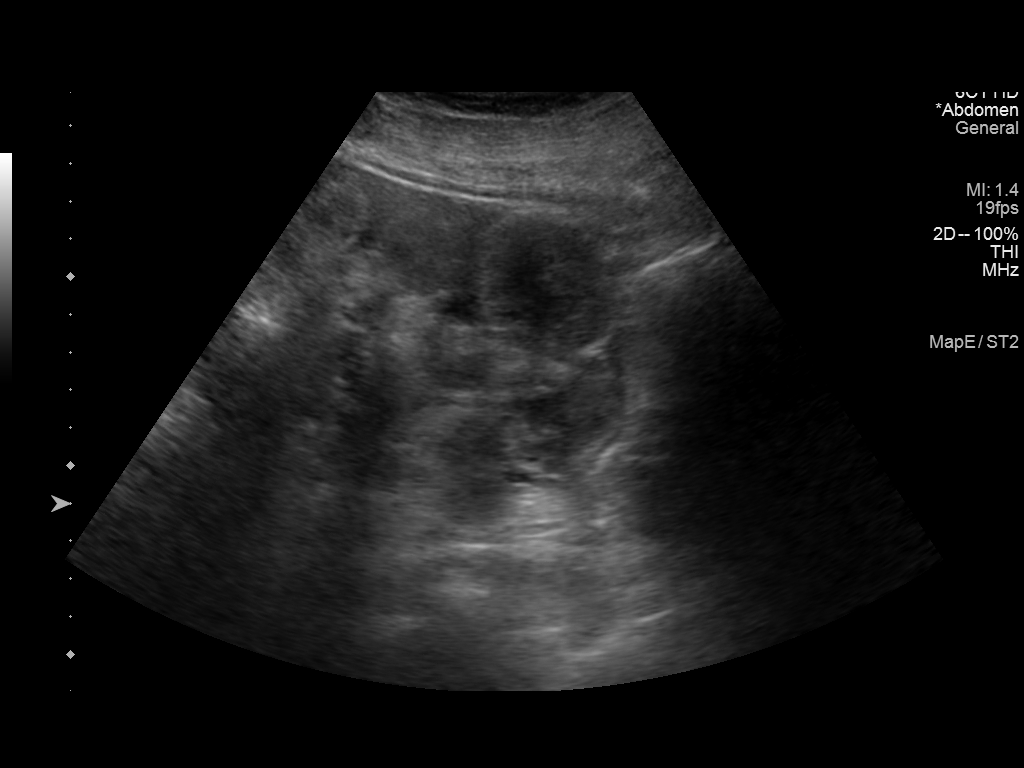
[im 5/6]
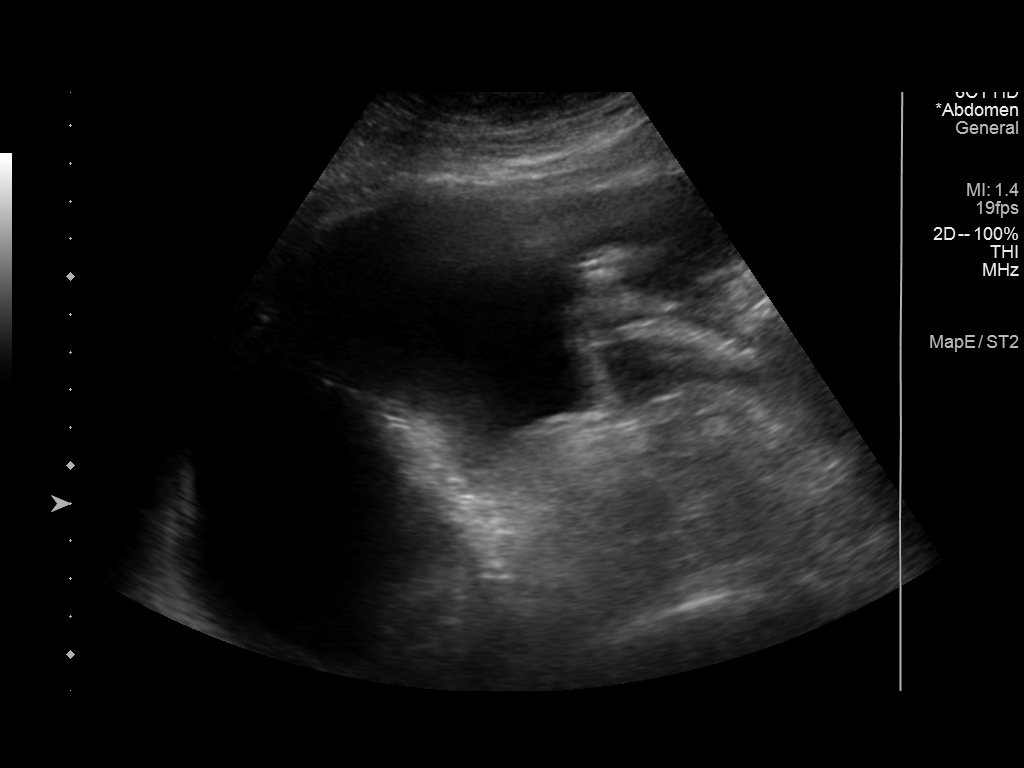
[im 6/6]
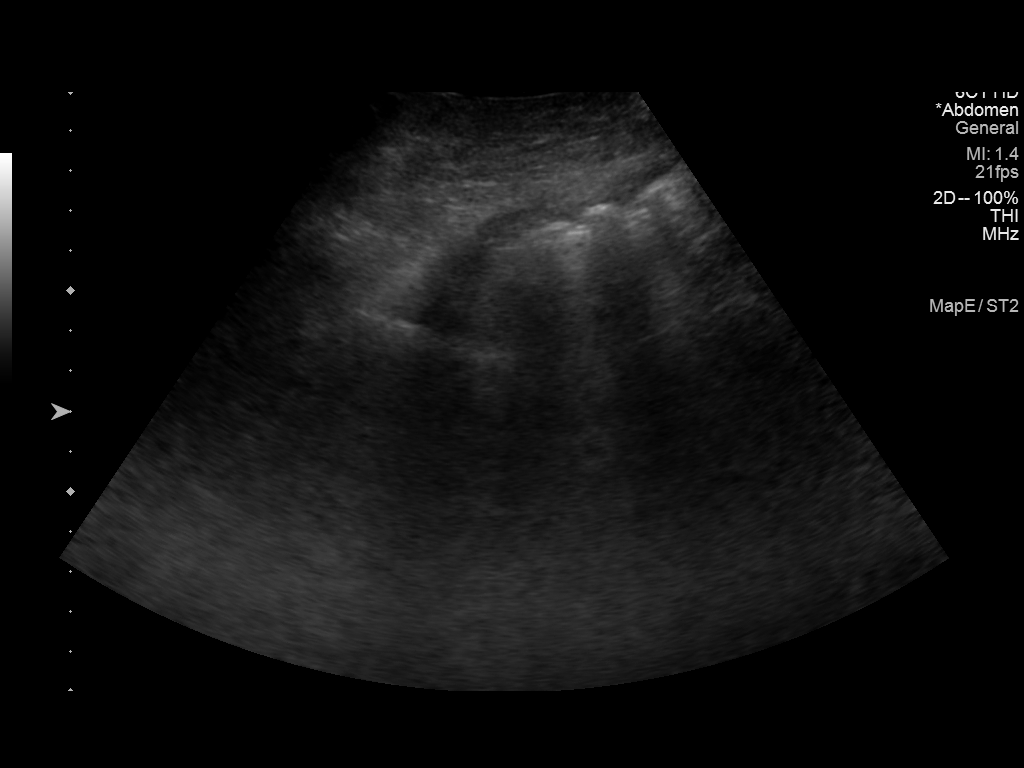

[6 of 6 positions shown; findings below may reference images not displayed]

EXAM:
ULTRASOUND GUIDED DIAGNOSTIC AND THERAPEUTIC PARACENTESIS

MEDICATIONS:
None.

COMPLICATIONS:
None immediate.

PROCEDURE:
Informed written consent was obtained from the patient after a
discussion of the risks, benefits and alternatives to treatment. A
timeout was performed prior to the initiation of the procedure.

Initial ultrasound scanning demonstrates a small amount of ascites
within the left lower abdominal quadrant. The left lower abdomen was
prepped and draped in the usual sterile fashion. 1% lidocaine was
used for local anesthesia.

Following this, a Yueh catheter was introduced. An ultrasound image
was saved for documentation purposes. The paracentesis was
performed. The catheter was removed and a dressing was applied. The
patient tolerated the procedure well without immediate post
procedural complication.
FINDINGS: A total of approximately 1.1 liters of clear, golden yellow fluid
was removed. Samples were sent to the laboratory as requested by the
clinical team.
IMPRESSION: Successful ultrasound-guided diagnostic and therapeutic paracentesis
yielding 1.1 liters of peritoneal fluid.

## 2017-05-17 IMAGING — CT CT HEAD W/O CM
1 series · 16 of 30 positions shown, 20 images · non-contrast
Comparison: None.

CLINICAL DATA: 49-year-old female with confusion

EXAM:
CT HEAD WITHOUT CONTRAST
TECHNIQUE: Contiguous axial images were obtained from the base of the skull
through the vertex without intravenous contrast.

[Series 2: headseq 4.8 h45s · axial · 0.39mm/px · z∈[+1143,+1295]mm · 16 of 36 slices shown, 20 images]
[im 2/36  brain]
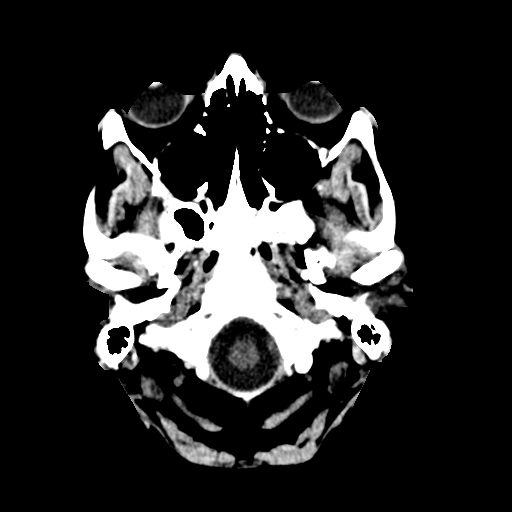
[im 2/36  bone]
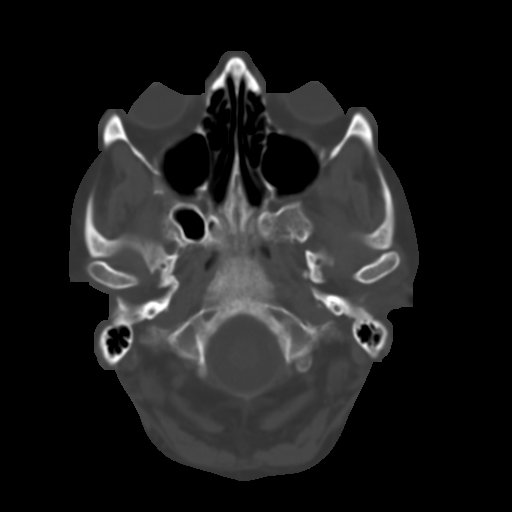
[im 4/36  brain]
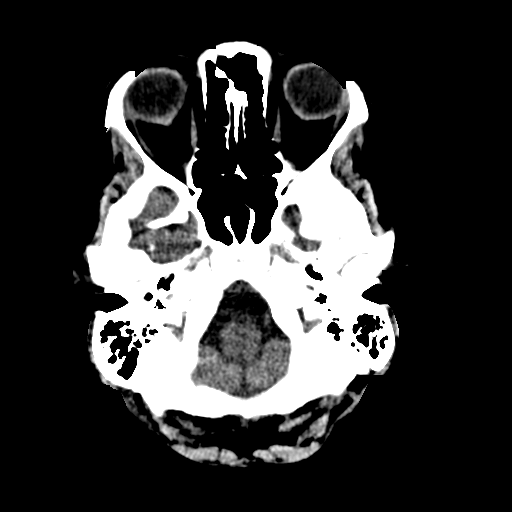
[im 7/36  brain]
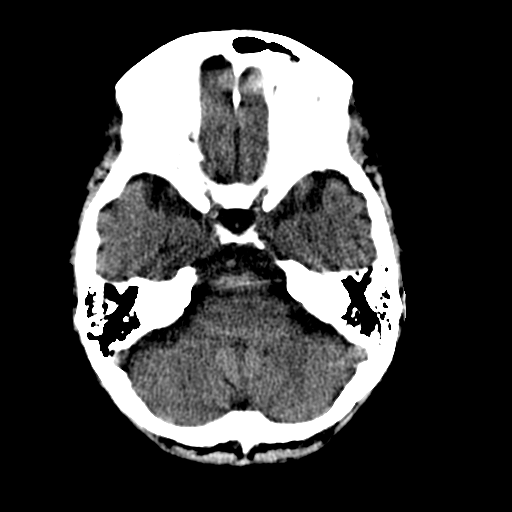
[im 9/36  brain]
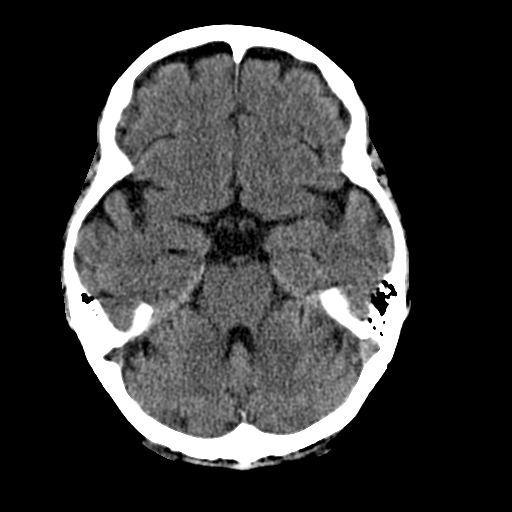
[im 10/36  brain]
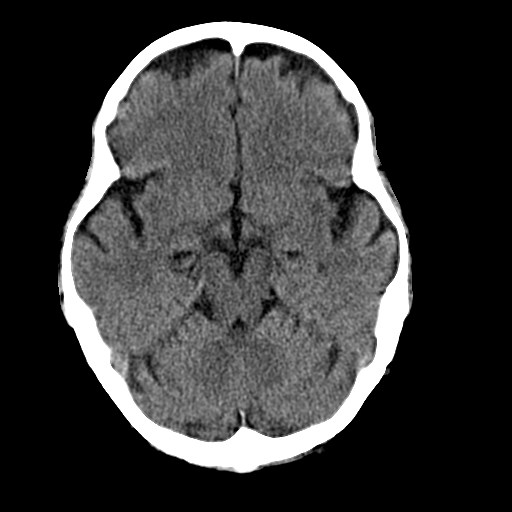
[im 10/36  bone]
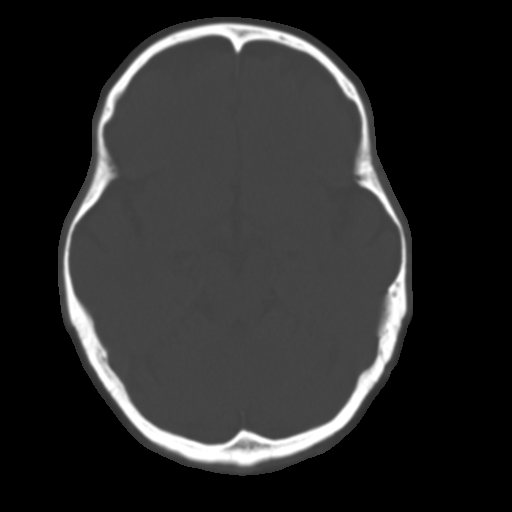
[im 13/36  brain]
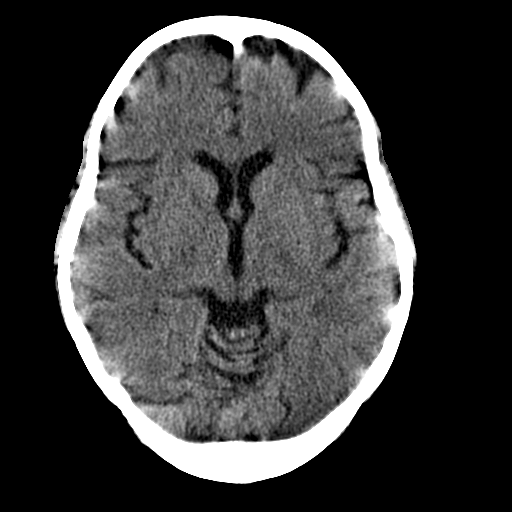
[im 15/36  brain]
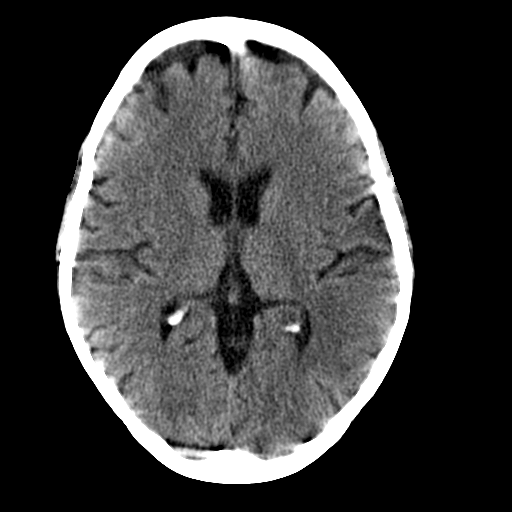
[im 17/36  brain]
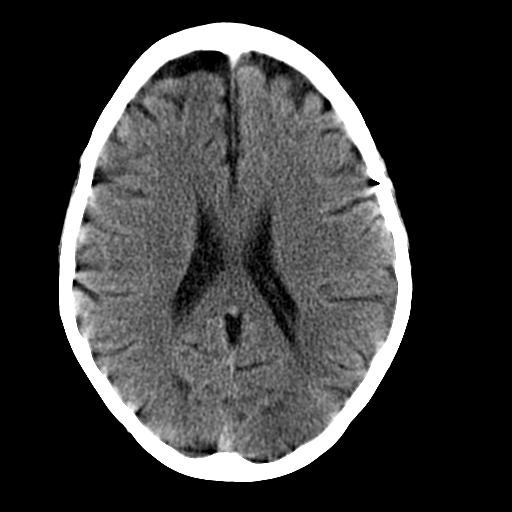
[im 19/36  brain]
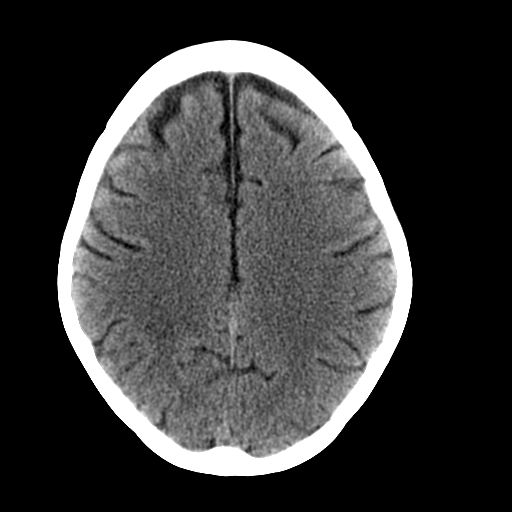
[im 19/36  bone]
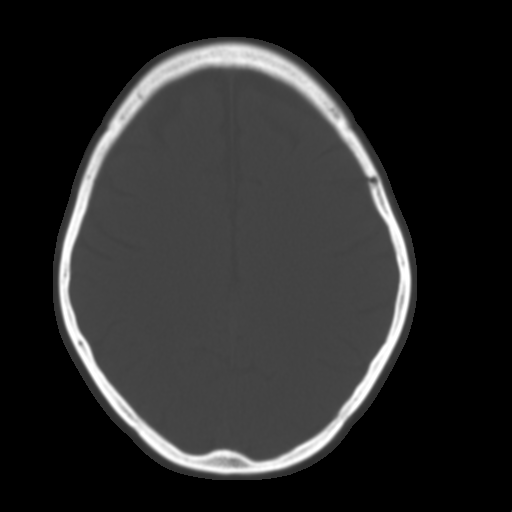
[im 21/36  brain]
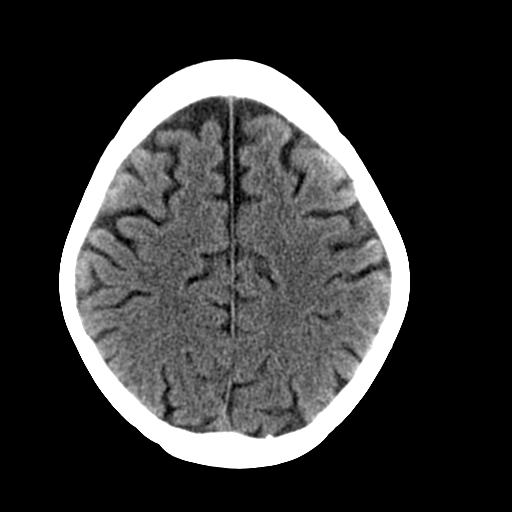
[im 23/36  brain]
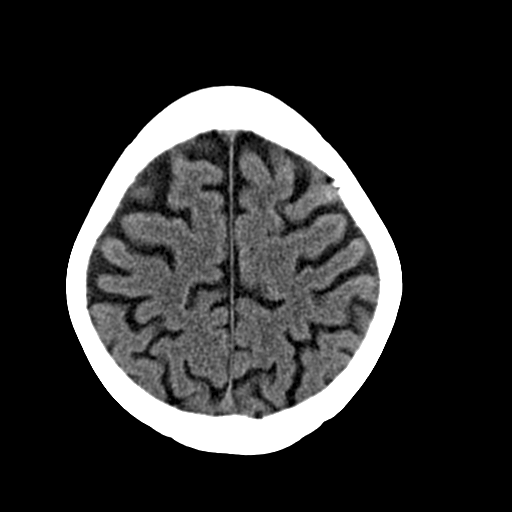
[im 26/36  brain]
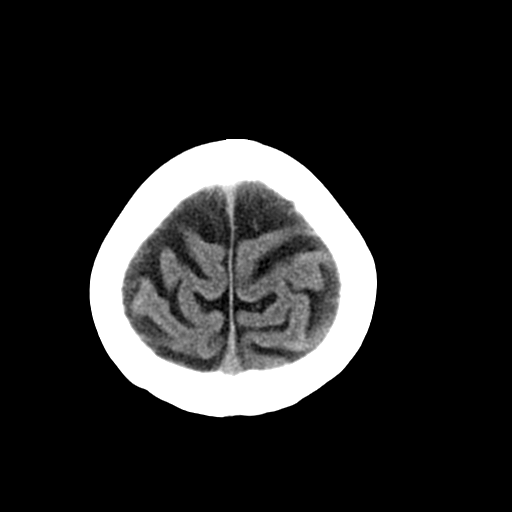
[im 27/36  brain]
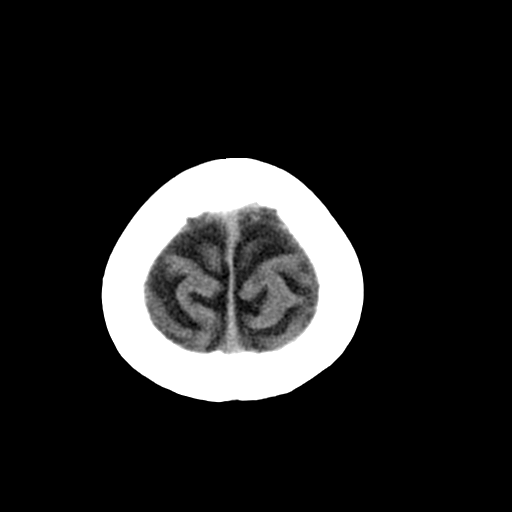
[im 27/36  bone]
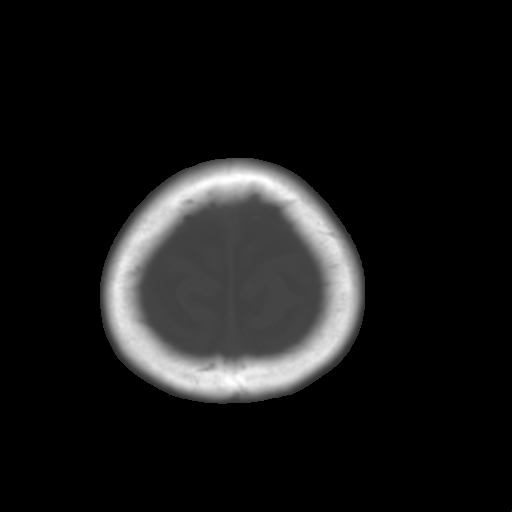
[im 29/36  brain]
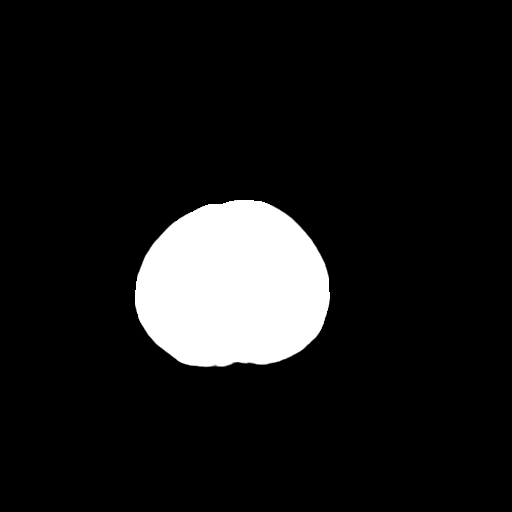
[im 32/36  brain]
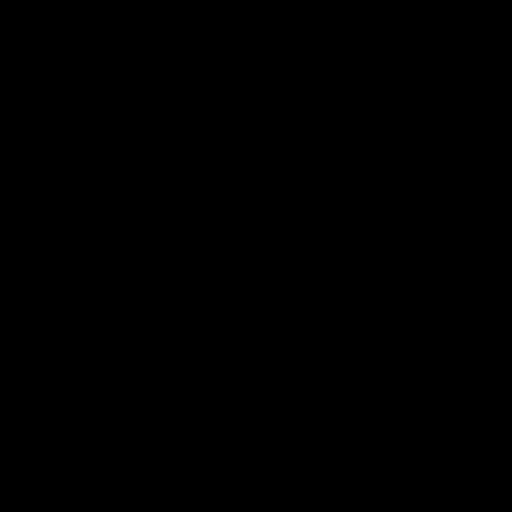
[im 34/36  brain]
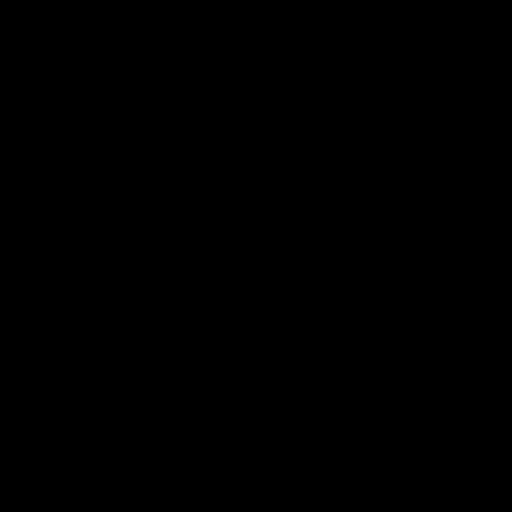

[16 of 30 positions shown; findings below may reference images not displayed]

FINDINGS: The ventricles and the sulci are appropriate in size for the
patient's age. There is no intracranial hemorrhage. No midline shift
or mass effect identified. The gray-white matter differentiation is
preserved.

The visualized paranasal sinuses and mastoid air cells are well
aerated. The calvarium is intact.
IMPRESSION: No acute intracranial pathology.
